# Patient Record
Sex: Female | Born: 1975 | Race: White | Hispanic: No | Marital: Married | Smoking: Never smoker
Health system: Southern US, Community
[De-identification: ages and names within clinical notes are randomized; demographics above are authoritative.]

## PROBLEM LIST (undated history)

## (undated) DIAGNOSIS — C4490 Unspecified malignant neoplasm of skin, unspecified: Secondary | ICD-10-CM

## (undated) DIAGNOSIS — R112 Nausea with vomiting, unspecified: Secondary | ICD-10-CM

## (undated) DIAGNOSIS — N871 Moderate cervical dysplasia: Secondary | ICD-10-CM

## (undated) DIAGNOSIS — T753XXA Motion sickness, initial encounter: Secondary | ICD-10-CM

## (undated) DIAGNOSIS — R87619 Unspecified abnormal cytological findings in specimens from cervix uteri: Secondary | ICD-10-CM

## (undated) DIAGNOSIS — Z8041 Family history of malignant neoplasm of ovary: Secondary | ICD-10-CM

## (undated) DIAGNOSIS — R079 Chest pain, unspecified: Secondary | ICD-10-CM

## (undated) DIAGNOSIS — F329 Major depressive disorder, single episode, unspecified: Secondary | ICD-10-CM

## (undated) DIAGNOSIS — K219 Gastro-esophageal reflux disease without esophagitis: Secondary | ICD-10-CM

## (undated) DIAGNOSIS — F32A Depression, unspecified: Secondary | ICD-10-CM

## (undated) DIAGNOSIS — H8109 Meniere's disease, unspecified ear: Secondary | ICD-10-CM

## (undated) DIAGNOSIS — I471 Supraventricular tachycardia, unspecified: Secondary | ICD-10-CM

## (undated) DIAGNOSIS — J302 Other seasonal allergic rhinitis: Secondary | ICD-10-CM

## (undated) DIAGNOSIS — N3010 Interstitial cystitis (chronic) without hematuria: Secondary | ICD-10-CM

## (undated) HISTORY — PX: CYSTOSCOPY: SUR368

## (undated) HISTORY — DX: Moderate cervical dysplasia: N87.1

## (undated) HISTORY — DX: Family history of malignant neoplasm of ovary: Z80.41

## (undated) HISTORY — DX: Unspecified abnormal cytological findings in specimens from cervix uteri: R87.619

## (undated) HISTORY — DX: Interstitial cystitis (chronic) without hematuria: N30.10

## (undated) HISTORY — DX: Unspecified malignant neoplasm of skin, unspecified: C44.90

## (undated) HISTORY — PX: CHOLECYSTECTOMY: SHX55

---

## 2004-12-29 ENCOUNTER — Ambulatory Visit: Payer: Self-pay | Admitting: Urology

## 2007-05-12 HISTORY — PX: CARPAL TUNNEL RELEASE: SHX101

## 2007-05-12 HISTORY — PX: DILATION AND CURETTAGE OF UTERUS: SHX78

## 2008-03-12 ENCOUNTER — Ambulatory Visit: Payer: Self-pay

## 2008-03-15 ENCOUNTER — Ambulatory Visit: Payer: Self-pay

## 2009-05-11 HISTORY — PX: ABDOMINAL HYSTERECTOMY: SHX81

## 2009-06-12 ENCOUNTER — Ambulatory Visit: Payer: Self-pay

## 2009-06-20 ENCOUNTER — Ambulatory Visit: Payer: Self-pay

## 2012-10-30 ENCOUNTER — Emergency Department: Payer: Self-pay | Admitting: Internal Medicine

## 2014-02-23 ENCOUNTER — Ambulatory Visit: Payer: Self-pay | Admitting: Otolaryngology

## 2014-03-19 DIAGNOSIS — T7840XA Allergy, unspecified, initial encounter: Secondary | ICD-10-CM | POA: Insufficient documentation

## 2014-03-19 DIAGNOSIS — K219 Gastro-esophageal reflux disease without esophagitis: Secondary | ICD-10-CM | POA: Insufficient documentation

## 2014-05-11 DIAGNOSIS — N871 Moderate cervical dysplasia: Secondary | ICD-10-CM

## 2014-05-11 HISTORY — PX: LEEP: SHX91

## 2014-05-11 HISTORY — DX: Moderate cervical dysplasia: N87.1

## 2014-06-12 ENCOUNTER — Ambulatory Visit: Payer: Self-pay | Admitting: Adult Health

## 2014-09-28 ENCOUNTER — Emergency Department
Admission: EM | Admit: 2014-09-28 | Discharge: 2014-09-29 | Disposition: A | Discharge status: Home / Self Care | Payer: 59 | Attending: Emergency Medicine | Admitting: Emergency Medicine

## 2014-09-28 ENCOUNTER — Encounter: Payer: Self-pay | Admitting: Medical Oncology

## 2014-09-28 DIAGNOSIS — N99820 Postprocedural hemorrhage and hematoma of a genitourinary system organ or structure following a genitourinary system procedure: Secondary | ICD-10-CM | POA: Insufficient documentation

## 2014-09-28 DIAGNOSIS — Y838 Other surgical procedures as the cause of abnormal reaction of the patient, or of later complication, without mention of misadventure at the time of the procedure: Secondary | ICD-10-CM | POA: Insufficient documentation

## 2014-09-28 DIAGNOSIS — N939 Abnormal uterine and vaginal bleeding, unspecified: Secondary | ICD-10-CM

## 2014-09-28 HISTORY — DX: Depression, unspecified: F32.A

## 2014-09-28 HISTORY — DX: Major depressive disorder, single episode, unspecified: F32.9

## 2014-09-28 HISTORY — DX: Gastro-esophageal reflux disease without esophagitis: K21.9

## 2014-09-28 HISTORY — DX: Other seasonal allergic rhinitis: J30.2

## 2014-09-28 HISTORY — DX: Chest pain, unspecified: R07.9

## 2014-09-28 LAB — CBC
HEMATOCRIT: 41.7 % (ref 35.0–47.0)
Hemoglobin: 14.2 g/dL (ref 12.0–16.0)
MCH: 31.3 pg (ref 26.0–34.0)
MCHC: 34 g/dL (ref 32.0–36.0)
MCV: 91.9 fL (ref 80.0–100.0)
Platelets: 379 10*3/uL (ref 150–440)
RBC: 4.54 MIL/uL (ref 3.80–5.20)
RDW: 12.9 % (ref 11.5–14.5)
WBC: 14.5 10*3/uL — AB (ref 3.6–11.0)

## 2014-09-28 MED ORDER — HYDROMORPHONE HCL 1 MG/ML IJ SOLN
INTRAMUSCULAR | Status: AC
  Administered 2014-09-28: 1 mg via INTRAMUSCULAR
  Filled 2014-09-28: qty 1

## 2014-09-28 MED ORDER — HYDROMORPHONE HCL 1 MG/ML IJ SOLN
1.0000 mg | Freq: Once | INTRAMUSCULAR | Status: AC
  Administered 2014-09-28: 1 mg via INTRAMUSCULAR

## 2014-09-28 NOTE — ED Notes (Signed)
Pt ambulatory to triage with reports that she had LEEP procedure last week and since then pt has been having heavy vaginal bleeding, seen pcp and they used silver nitrate Tuesday and again today, bleeding continues.

## 2014-09-28 NOTE — Consult Note (Addendum)
Obstetrics & Gynecology Consult H&P    Consulting Department: Emergency Department  Consulting Physician: Francene Castle MD  Consulting Question: LEEP, postoperative bleeding   History of Present Illness: Patient is a 39 y.o. history of prior hysterectomy, recent LEEP procedure in the last week with continued postoperative bleeding.  She has been seen and evaluated twice for her bleeding the in the past week with the last time being earlier today by Dr. Kenton Kingfisher.  She started feeling lightheaded and dizzy and was instructed to present to the ER should she be anemic enough to require transfusion or bleeding was heavy enough to warrant surgical intervention via cautery of the LEEP bed +/- stay sutures.    Review of Systems:10 point review of systems  Past Medical History:  Past Medical History  Diagnosis Date  . Chest pain   . GERD (gastroesophageal reflux disease)   . Depression   . Seasonal allergies     Past Surgical History:  Past Surgical History  Procedure Laterality Date  . Cholecystectomy    . Abdominal hysterectomy      Obstetric History: No obstetric history on file.  Family History:  No family history on file.  Social History:  History   Social History  . Marital Status: Married    Spouse Name: N/A  . Number of Children: N/A  . Years of Education: N/A   Occupational History  . Not on file.   Social History Main Topics  . Smoking status: Never Smoker   . Smokeless tobacco: Not on file  . Alcohol Use: No  . Drug Use: Not on file  . Sexual Activity: Not on file   Other Topics Concern  . Not on file   Social History Narrative  . No narrative on file    Allergies:  No Known Allergies  Medications: Prior to Admission medications   Not on File    Physical Exam Vitals: Blood pressure 123/84, pulse 91, temperature 97.8 F (36.6 C), temperature source Oral, resp. rate 18, height 5\' 7"  (1.702 m), weight 65.772 kg (145 lb), SpO2 100 %. General:  NAD HEENT: normocephalic, anicteric Pulmonary: no increased work of breathing Genitourinary: Normal external female genitalia, minimal to moderate amount of bleeding noted on perineum.  Speculum exam with one scopet of old blood in vault, no active bleeding noted from LEEP bed.  Surgicel dressing applied to LEEP bed to ensure hemostasis Extremities: no edema, no erythema Neurologic: no gross neurologic deficitis Skin: normal Psychiatric: A&O x3, mood apporpriate, affect full  Labs: Results for orders placed or performed during the hospital encounter of 09/28/14 (from the past 72 hour(s))  CBC (add if pt pregnant)     Status: Abnormal   Collection Time: 09/28/14  6:24 PM  Result Value Ref Range   WBC 14.5 (H) 3.6 - 11.0 K/uL   RBC 4.54 3.80 - 5.20 MIL/uL   Hemoglobin 14.2 12.0 - 16.0 g/dL   HCT 41.7 35.0 - 47.0 %   MCV 91.9 80.0 - 100.0 fL   MCH 31.3 26.0 - 34.0 pg   MCHC 34.0 32.0 - 36.0 g/dL   RDW 12.9 11.5 - 14.5 %   Platelets 379 150 - 440 K/uL    Imaging No results found.  Assessment: 39 y.o. No obstetric history on file.  Plan: - appropriate amount of bleeding for post LEEP procedure, LEEP bed looks relatively hemostatic but surgicel dressing applied - routine bleeding precautions. - If bleeding persists discussed possibility of cauterizing LEEP bed in office -  Rx percocet 5/325 sig 1 tab po q4hrs prn pain #20tab provided

## 2014-09-28 NOTE — ED Provider Notes (Signed)
[facility] Emergency Department Provider Note  ____________________________________________  Time seen: 2030  I have reviewed the triage vital signs and the nursing notes.   HISTORY  Chief Complaint Vaginal Bleeding  status post LEEP procedure 1 week ago.    HPI Ann Rhodes is a 39 y.o. female who had a LEEP procedure 1 week ago. She has had vaginal bleeding since then. At times it is heavy. She was seen by her gynecologist on Tuesday and the area was cauterized chemically with silver nitrate. The bleeding has continued and she was seen in the office again earlier today. It was again treated with silver nitrate. Patient now reports that she has ongoing vaginal bleeding. She reports going through 4 pads in 1 hour. She has felt weak and lightheaded at times. She complains of vaginal pain.   Past Medical History  Diagnosis Date  . Chest pain   . GERD (gastroesophageal reflux disease)   . Depression   . Seasonal allergies     There are no active problems to display for this patient.   Past Surgical History  Procedure Laterality Date  . Cholecystectomy    . Abdominal hysterectomy     LEEP procedure  No current outpatient prescriptions on file.  Allergies Review of patient's allergies indicates no known allergies.  No family history on file.  Social History History  Substance Use Topics  . Smoking status: Never Smoker   . Smokeless tobacco: Not on file  . Alcohol Use: No    Review of Systems Constitutional: Negative for fever. ENT: Negative for sore throat. Cardiovascular: Negative for chest pain. Respiratory: Negative for shortness of breath. Gastrointestinal: Negative for abdominal pain, vomiting and diarrhea. Genitourinary: Notable for vaginal bleeding status post LEEP. See history of present illness. Musculoskeletal: Negative for back pain. Skin: Negative for rash. Neurological: Negative for headaches   10-point ROS otherwise  negative.  ____________________________________________   PHYSICAL EXAM:  VITAL SIGNS: ED Triage Vitals  Enc Vitals Group     BP 09/28/14 1808 138/85 mmHg     Pulse Rate 09/28/14 1808 91     Resp --      Temp 09/28/14 1808 97.8 F (36.6 C)     Temp Source 09/28/14 1808 Oral     SpO2 09/28/14 1808 100 %     Weight 09/28/14 1808 145 lb (65.772 kg)     Height 09/28/14 1808 5\' 7"  (1.702 m)     Head Cir --      Peak Flow --      Pain Score 09/28/14 1818 9     Pain Loc --      Pain Edu? --      Excl. in [place]? --     Constitutional: Alert and oriented. Well appearing and in no distress. ENT   Head: Normocephalic and atraumatic.   Nose: No congestion/rhinnorhea.   Mouth/Throat: Mucous membranes are moist. Cardiovascular: Normal rate, regular rhythm. Respiratory: Normal respiratory effort without tachypnea. Breath sounds are clear and equal bilaterally. No wheezes/rales/rhonchi. Gastrointestinal: Soft and nontender. No distention.  Back: There is no CVA tenderness. Musculoskeletal: Nontender with normal range of motion in all extremities.  No noted edema. Neurologic:  Normal speech and language. No gross focal neurologic deficits are appreciated.  Skin:  Skin is warm, dry. No rash noted. Psychiatric: Mood and affect are normal. Speech and behavior are normal.  ____________________________________________    LABS (pertinent positives/negatives)  White blood cell count 14.5, hemoglobin 14.2  ____________________________________________ ____________________________________________  INITIAL IMPRESSION / ASSESSMENT AND PLAN / ED COURSE  This patient overall looks well. Her hemoglobin level is quite good despite the vaginal bleeding she has had. Given that this patient has been seen by gynecology twice this week with silver nitrate, I have called the gynecologist on call Dr. Georgianne Fick. He will see her in the emergency department for further  evaluation.  ----------------------------------------- 9:53 PM on 09/28/2014 -----------------------------------------  Dr. Star Age saw the patient emergency department. He had her bleeding controlled. He wrote her a prescription pain medication. She will be followed up at [facility] side GYN. ____________________________________________   FINAL CLINICAL IMPRESSION(S) / ED DIAGNOSES  Final diagnoses:  Vaginal bleeding      Ahmed Prima, MD 09/28/14 2156

## 2014-09-28 NOTE — ED Notes (Signed)
Pt. States going to PCP on Tuesday and Today and used silver nitrate with little effectiveness.

## 2014-11-21 ENCOUNTER — Encounter (INDEPENDENT_AMBULATORY_CARE_PROVIDER_SITE_OTHER): Payer: Self-pay

## 2014-11-21 ENCOUNTER — Ambulatory Visit (INDEPENDENT_AMBULATORY_CARE_PROVIDER_SITE_OTHER): Payer: 59 | Admitting: Cardiovascular Disease

## 2014-11-21 ENCOUNTER — Encounter: Payer: Self-pay | Admitting: Cardiovascular Disease

## 2014-11-21 VITALS — BP 112/78 | HR 75 | Ht 67.0 in | Wt 146.8 lb

## 2014-11-21 DIAGNOSIS — R079 Chest pain, unspecified: Secondary | ICD-10-CM

## 2014-11-21 DIAGNOSIS — R0602 Shortness of breath: Secondary | ICD-10-CM | POA: Insufficient documentation

## 2014-11-21 DIAGNOSIS — F329 Major depressive disorder, single episode, unspecified: Secondary | ICD-10-CM | POA: Diagnosis not present

## 2014-11-21 DIAGNOSIS — H8109 Meniere's disease, unspecified ear: Secondary | ICD-10-CM | POA: Insufficient documentation

## 2014-11-21 DIAGNOSIS — F32A Depression, unspecified: Secondary | ICD-10-CM | POA: Insufficient documentation

## 2014-11-21 NOTE — Progress Notes (Signed)
Patient ID: Ann Rhodes, female    DOB: [birth year], 39 y.o.   MRN: [id number]  HPI Comments: Ann Rhodes is a 39 year old woman with history of Mnire's disease for which she takes triamterene HCTZ, GERD, presenting for evaluation of chest pain. She was recently seen by Dr. Merryl Hacker cardiology for similar symptoms.  Earlier in 2016 when her symptoms developed, she performed a treadmill stress echo. Review of the report shows she had good exercise tolerance, adequate blood pressure control, with no EKG changes concerning for ischemia, no wall motion abnormality concerning for ischemia.  She was started on isosorbide mononitrate for possible coronary spasm. On this medication she felt better with improved symptoms. She tried to stop the medication and symptoms recurred. Several weeks ago she went hiking and climbing a mountain, had significant shortness of breath and fatigue. Since then she has had persistent chest pain symptoms even at rest, feels worse even taking her isosorbide. Now complains of chronic shortness of breath, fatigue, unable to do anything   EKG shows normal sinus rhythm with rate 75 bpm, no significant ST or T-wave changes  06/12/2014 had barium swallow which was normal      No Known Allergies  No current outpatient prescriptions on file prior to visit.   No current facility-administered medications on file prior to visit.    Past Medical History  Diagnosis Date  . Chest pain   . GERD (gastroesophageal reflux disease)   . Depression   . Seasonal allergies     Past Surgical History  Procedure Laterality Date  . Cholecystectomy    . Abdominal hysterectomy      Social History  reports that she has never smoked. She does not have any smokeless tobacco history on file. She reports that she does not drink alcohol or use illicit drugs.  Family History family history includes Heart disease in her father.   Review of Systems  Constitutional:  Negative.   HENT: Negative.   Respiratory: Positive for shortness of breath.   Cardiovascular: Positive for chest pain.  Gastrointestinal: Negative.   Musculoskeletal: Negative.   Skin: Negative.   Neurological: Negative.   Hematological: Negative.   Psychiatric/Behavioral: Negative.   All other systems reviewed and are negative.  BP 112/78 mmHg  Pulse 75  Ht 5\' 7"  (1.702 m)  Wt 146 lb 12 oz (66.565 kg)  BMI 22.98 kg/m2  Physical Exam  Constitutional: She is oriented to person, place, and time. She appears well-developed and well-nourished.  HENT:  Head: Normocephalic.  Nose: Nose normal.  Mouth/Throat: Oropharynx is clear and moist.  Eyes: Conjunctivae are normal. Pupils are equal, round, and reactive to light.  Neck: Normal range of motion. Neck supple. No JVD present.  Cardiovascular: Normal rate, regular rhythm, S1 normal, S2 normal, normal heart sounds and intact distal pulses.  Exam reveals no gallop and no friction rub.   No murmur heard. Pulmonary/Chest: Effort normal and breath sounds normal. No respiratory distress. She has no wheezes. She has no rales. She exhibits no tenderness.  Abdominal: Soft. Bowel sounds are normal. She exhibits no distension. There is no tenderness.  Musculoskeletal: Normal range of motion. She exhibits no edema or tenderness.  Lymphadenopathy:    She has no cervical adenopathy.  Neurological: She is alert and oriented to person, place, and time. Coordination normal.  Skin: Skin is warm and dry. No rash noted. No erythema.  Psychiatric: She has a normal mood and affect. Her behavior is normal. Judgment and thought content  normal.    Assessment and Plan  Nursing note and vitals reviewed.

## 2014-11-21 NOTE — Assessment & Plan Note (Signed)
Currently on sertraline. Unclear if this is playing a role in her current symptoms

## 2014-11-21 NOTE — Assessment & Plan Note (Signed)
Atypical chest pain, even at rest today in the clinic Does not appear to be musculoskeletal. Low likelihood of ischemia given no risk factors   she is a nonsmoker, no diabetes, total cholesterol 160 She is troubled by her persistent pain. Prior negative stress test January 2016. She is looking for answers, not more medications. We did offer Ranexa for possible symptom relief. She did not want new medication at this time Encouraged her to stay on her isosorbide if this helps We have ordered a chest CT scan with contrast to rule out structural abnormality, cause of her shortness of breath, chest pain

## 2014-11-21 NOTE — Assessment & Plan Note (Signed)
She takes triamterene HCTZ, no recent symptoms

## 2014-11-21 NOTE — Assessment & Plan Note (Signed)
Etiology unclear, report details normal echocardiogram. Likely no point in repeating this echocardiogram. No signs of heart failure.  She denies having any asthma. Last resort would be to try albuterol inhaler for symptom relief if symptoms persist

## 2014-11-21 NOTE — Patient Instructions (Addendum)
You are doing well. No medication changes were made.  We will schedule a chest CT scan with contrast for shortness of breath and chest pain You are scheduled in the [place] location on Monday, July 18 @ 2:00 Please arrive @ 1:45 to register  Please call us if you have new issues that need to be addressed before your next appt.   CT Scan A computed tomography (CT) scan is a specialized X-ray scan. It uses X-rays and a computer to make pictures of different areas of your body. A CT scan can offer more detailed information than a regular X-ray exam. The CT scan provides data about internal organs, soft tissue structures, blood vessels, and bones.  The CT scanner is a large machine that takes pictures of your body as you move through the opening.  LET [facility] CARE PROVIDER KNOW ABOUT:  Any allergies you have.   All medicines you are taking, including vitamins, herbs, eye drops, creams, and over-the-counter medicines.   Previous problems you or members of your family have had with the use of anesthetics.   Any blood disorders you have.   Previous surgeries you have had.   Medical conditions you have. RISKS AND COMPLICATIONS  Generally, this is a safe procedure. However, as with any procedure, problems can occur. Possible problems include:   An allergic reaction to the contrast material.   Development of cancer from excessive exposure to radiation. The risk of this is small.  BEFORE THE PROCEDURE   The day before the test, stop drinking caffeinated beverages. These include energy drinks, tea, soda, coffee, and hot chocolate.   On the day of the test:  About 4 hours before the test, stop eating and drinking anything but water as advised by your health care provider.   Avoid wearing jewelry. You will have to partly or fully undress and wear a hospital gown. PROCEDURE   You will be asked to lie on a table with your arms above your head.   If contrast dye is to be used  for the test, an IV tube will be inserted in your arm. The contrast dye will be injected into the IV tube. You might feel warm, or you may get a metallic taste in your mouth.   The table you will be lying on will move into a large machine that will do the scanning.   You will be able to see, hear, and talk to the person running the machine while you are in it. Follow that person's directions.   The CT machine will move around you to take pictures. Do not move while it is scanning. This helps to get a good image.   When the best possible pictures have been taken, the machine will be turned off. The table will be moved out of the machine. The IV tube will then be removed. AFTER THE PROCEDURE  Ask your health care provider when to follow up for your test results. Document Released: 06/04/2004 Document Revised: 05/02/2013 Document Reviewed: 01/02/2013 [facility] Patient Information 2015 [place], [place]. This information is not intended to replace advice given to you by your health care provider. Make sure you discuss any questions you have with your health care provider.

## 2014-11-26 ENCOUNTER — Ambulatory Visit
Admission: RE | Admit: 2014-11-26 | Discharge: 2014-11-26 | Disposition: A | Discharge status: Home / Self Care | Payer: 59 | Source: Ambulatory Visit | Attending: Cardiovascular Disease | Admitting: Cardiovascular Disease

## 2014-11-26 DIAGNOSIS — R079 Chest pain, unspecified: Secondary | ICD-10-CM | POA: Insufficient documentation

## 2014-11-26 DIAGNOSIS — R0602 Shortness of breath: Secondary | ICD-10-CM

## 2014-11-26 MED ORDER — IOHEXOL 350 MG/ML SOLN
75.0000 mL | Freq: Once | INTRAVENOUS | Status: AC | PRN
  Administered 2014-11-26: 75 mL via INTRAVENOUS

## 2016-09-11 ENCOUNTER — Ambulatory Visit (INDEPENDENT_AMBULATORY_CARE_PROVIDER_SITE_OTHER): Payer: 59 | Admitting: Obstetrics and Gynecology

## 2016-09-11 ENCOUNTER — Encounter: Payer: Self-pay | Admitting: Obstetrics and Gynecology

## 2016-09-11 VITALS — BP 102/60 | HR 76 | Ht 67.0 in | Wt 162.0 lb

## 2016-09-11 DIAGNOSIS — Z1239 Encounter for other screening for malignant neoplasm of breast: Secondary | ICD-10-CM

## 2016-09-11 DIAGNOSIS — Z1231 Encounter for screening mammogram for malignant neoplasm of breast: Secondary | ICD-10-CM | POA: Diagnosis not present

## 2016-09-11 DIAGNOSIS — Z1151 Encounter for screening for human papillomavirus (HPV): Secondary | ICD-10-CM | POA: Diagnosis not present

## 2016-09-11 DIAGNOSIS — Z124 Encounter for screening for malignant neoplasm of cervix: Secondary | ICD-10-CM

## 2016-09-11 DIAGNOSIS — Z01419 Encounter for gynecological examination (general) (routine) without abnormal findings: Secondary | ICD-10-CM

## 2016-09-11 DIAGNOSIS — Z803 Family history of malignant neoplasm of breast: Secondary | ICD-10-CM

## 2016-09-11 NOTE — Progress Notes (Signed)
Chief Complaint  Patient presents with  . Gynecologic Exam     HPI:      Ann Rhodes is a 41 y.o. G1P1001 who LMP was No LMP recorded. Patient has had a hysterectomy., presents today for her annual examination.  Her menses are absent due to hyst [facility] due to adenomyosis). She does not have intermenstrual bleeding.  Sex activity: single partner, contraception - status post hysterectomy.  Last Pap: July 22, 2015  Results were: ASCUS with NEGATIVE high risk HPV . 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 Hx of STDs: HPV  Last mammogram: July 24, 2015  Results were: normal--routine follow-up in 12 months There is a FH of breast cancer in her pat aunt. There is no FH of ovarian cancer. The patient does do self-breast exams.  Tobacco use: The patient denies current or previous tobacco use. Alcohol use: none Exercise: very active  She does get adequate calcium and Vitamin D in her diet.    Past Medical History:  Diagnosis Date  . Abnormal Pap smear of cervix   . Chest pain   . Depression   . Dysplasia of cervix, high grade CIN 2 2016  . GERD (gastroesophageal reflux disease)   . Interstitial cystitis   . Seasonal allergies   . Skin cancer     Past Surgical History:  Procedure Laterality Date  . ABDOMINAL HYSTERECTOMY  2011   [facility]  . CARPAL TUNNEL RELEASE  2009  . CHOLECYSTECTOMY    . CYSTOSCOPY    . DILATION AND CURETTAGE OF UTERUS  2009   [facility]  . LEEP  2016   [facility]    Family History  Problem Relation Age of Onset  . Diabetes Mother   . Heart disease Father   . Hypertension Father   . Colon cancer Maternal Grandmother 58  . Diabetes Maternal Grandmother   . Diabetes Paternal Grandmother   . Diabetes Paternal Grandfather   . Breast cancer Paternal Aunt 43  . Cervical cancer Maternal Aunt     Social History   Social History  . Marital status: Married    Spouse name: N/A  . Number of children: N/A  . Years of education: N/A    Occupational History  . Not on file.   Social History Main Topics  . Smoking status: Never Smoker  . Smokeless tobacco: Never Used  . Alcohol use No  . Drug use: No  . Sexual activity: Yes   Other Topics Concern  . Not on file   Social History Narrative  . No narrative on file     Current Outpatient Prescriptions:  .  ALPRAZolam (XANAX) 0.25 MG tablet, , Disp: , Rfl:  .  Cholecalciferol (VITAMIN D3) 2000 UNITS capsule, Take 2,000 Units by mouth daily., Disp: , Rfl:  .  isosorbide mononitrate (IMDUR) 30 MG 24 hr tablet, Take 30 mg by mouth daily., Disp: , Rfl:  .  meloxicam (MOBIC) 15 MG tablet, , Disp: , Rfl:  .  montelukast (SINGULAIR) 10 MG tablet, Take 10 mg by mouth at bedtime., Disp: , Rfl:  .  pantoprazole (PROTONIX) 40 MG tablet, Take 40 mg by mouth daily., Disp: , Rfl:  .  sertraline (ZOLOFT) 100 MG tablet, TAKE 1-2 TABLET BY MOUTH DAILY., Disp: , Rfl:  .  triamterene-hydrochlorothiazide (MAXZIDE-25) 37.5-25 MG per tablet, Take 1 tablet by mouth daily., Disp: , Rfl:   ROS:  Review of Systems  Constitutional: Negative for fever, malaise/fatigue  and weight loss.  HENT: Negative for congestion, ear pain and sinus pain.   Respiratory: Negative for cough, shortness of breath and wheezing.   Cardiovascular: Negative for chest pain, orthopnea and leg swelling.  Gastrointestinal: Negative for constipation, diarrhea, nausea and vomiting.  Genitourinary: Negative for dysuria, frequency, hematuria and urgency.       Breast ROS: negative   Musculoskeletal: Negative for back pain, joint pain and myalgias.  Skin: Negative for itching and rash.  Neurological: Negative for dizziness, tingling, focal weakness and headaches.  Endo/Heme/Allergies: Negative for environmental allergies. Does not bruise/bleed easily.  Psychiatric/Behavioral: Negative for depression and suicidal ideas. The patient is not nervous/anxious and does not have insomnia.     Objective: BP 102/60 (BP  Location: Left Arm, Patient Position: Sitting, Cuff Size: Normal)   Pulse 76   Ht 5\' 7"  (1.702 m)   Wt 162 lb (73.5 kg)   BMI 25.37 kg/m    Physical Exam  Constitutional: She is oriented to person, place, and time. She appears well-developed and well-nourished.  Genitourinary: Vagina normal. There is no rash or tenderness on the right labia. There is no rash or tenderness on the left labia. No erythema or tenderness in the vagina. No vaginal discharge found. Right adnexum does not display mass and does not display tenderness. Left adnexum does not display mass and does not display tenderness. Cervix does not exhibit motion tenderness or polyp.  Genitourinary Comments: UTERUS SURG ABSENT  Neck: Normal range of motion. No thyromegaly present.  Cardiovascular: Normal rate, regular rhythm and normal heart sounds.   No murmur heard. Pulmonary/Chest: Effort normal and breath sounds normal. Right breast exhibits no mass, no nipple discharge, no skin change and no tenderness. Left breast exhibits no mass, no nipple discharge, no skin change and no tenderness.  Abdominal: Soft. There is no tenderness. There is no guarding.  Musculoskeletal: Normal range of motion.  Neurological: She is alert and oriented to person, place, and time. No cranial nerve deficit.  Psychiatric: She has a normal mood and affect. Her behavior is normal.  Vitals reviewed.    Assessment/Plan: Encounter for annual routine gynecological examination  Cervical cancer screening - Plan: IGP, Aptima HPV  Screening for HPV (human papillomavirus) - Plan: IGP, Aptima HPV  Screening for breast cancer - Pt to sched mammo. - Plan: MM SCREENING BREAST TOMO BILATERAL  Family history of breast cancer - Pt doesn't qualify for genetic testing. - Plan: MM SCREENING BREAST TOMO BILATERAL             GYN counsel mammography screening, adequate intake of calcium and vitamin D, diet and exercise     F/U  Return in about 1 year (around  09/11/2017).  Alicia B. Copland, PA-C 09/11/2016 1:50 PM

## 2016-09-15 LAB — IGP, APTIMA HPV
HPV Aptima: NEGATIVE
PAP Smear Comment: 0

## 2016-10-09 ENCOUNTER — Ambulatory Visit
Admission: RE | Admit: 2016-10-09 | Discharge: 2016-10-09 | Disposition: A | Discharge status: Home / Self Care | Payer: 59 | Source: Ambulatory Visit | Attending: Obstetrics and Gynecology | Admitting: Obstetrics and Gynecology

## 2016-10-09 DIAGNOSIS — Z803 Family history of malignant neoplasm of breast: Secondary | ICD-10-CM

## 2016-10-09 DIAGNOSIS — Z1239 Encounter for other screening for malignant neoplasm of breast: Secondary | ICD-10-CM

## 2016-10-09 DIAGNOSIS — Z1231 Encounter for screening mammogram for malignant neoplasm of breast: Secondary | ICD-10-CM | POA: Insufficient documentation

## 2016-10-15 ENCOUNTER — Inpatient Hospital Stay
Admission: RE | Admit: 2016-10-15 | Discharge: 2016-10-15 | Disposition: A | Discharge status: Home / Self Care | Payer: Self-pay | Source: Ambulatory Visit | Attending: *Deleted | Admitting: *Deleted

## 2016-10-15 ENCOUNTER — Other Ambulatory Visit: Payer: Self-pay | Admitting: *Deleted

## 2016-10-15 DIAGNOSIS — Z9289 Personal history of other medical treatment: Secondary | ICD-10-CM

## 2016-10-18 ENCOUNTER — Encounter: Payer: Self-pay | Admitting: Obstetrics and Gynecology

## 2017-09-14 ENCOUNTER — Other Ambulatory Visit: Payer: Self-pay | Admitting: Family Medicine

## 2017-09-14 DIAGNOSIS — Z1231 Encounter for screening mammogram for malignant neoplasm of breast: Secondary | ICD-10-CM

## 2017-09-21 ENCOUNTER — Encounter: Payer: Self-pay | Admitting: Obstetrics and Gynecology

## 2017-09-21 ENCOUNTER — Ambulatory Visit (INDEPENDENT_AMBULATORY_CARE_PROVIDER_SITE_OTHER): Payer: Managed Care, Other (non HMO) | Admitting: Obstetrics and Gynecology

## 2017-09-21 VITALS — BP 118/72 | HR 68 | Ht 67.0 in | Wt 165.5 lb

## 2017-09-21 DIAGNOSIS — Z1239 Encounter for other screening for malignant neoplasm of breast: Secondary | ICD-10-CM

## 2017-09-21 DIAGNOSIS — Z124 Encounter for screening for malignant neoplasm of cervix: Secondary | ICD-10-CM

## 2017-09-21 DIAGNOSIS — Z01419 Encounter for gynecological examination (general) (routine) without abnormal findings: Secondary | ICD-10-CM | POA: Diagnosis not present

## 2017-09-21 DIAGNOSIS — Z1151 Encounter for screening for human papillomavirus (HPV): Secondary | ICD-10-CM

## 2017-09-21 DIAGNOSIS — Z1231 Encounter for screening mammogram for malignant neoplasm of breast: Secondary | ICD-10-CM | POA: Diagnosis not present

## 2017-09-21 NOTE — Patient Instructions (Signed)
I value your feedback and entrusting us with your care. If you get a [facility] patient survey, I would appreciate you taking the time to let us know about your experience today. Thank you! 

## 2017-09-21 NOTE — Progress Notes (Signed)
Chief Complaint  Patient presents with  . Gynecologic Exam     HPI:      [name] is a 42 y.o. G1P1001 who LMP was No LMP recorded. Patient has had a hysterectomy., presents today for her annual examination. Her menses are absent due to hyst [facility] due to adenomyosis). She does not have intermenstrual bleeding.  Sex activity: single partner, contraception - status post hysterectomy.  Last Pap: 09/11/16  Results were: negative cells with NEGATIVE high risk HPV . 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 Hx of STDs: HPV   Last mammogram: 10/09/16  Results were: normal--routine follow-up in 12 months There is a FH of breast cancer in her pat aunt, genetic testing not indicated. There is no FH of ovarian cancer. The patient does do self-breast exams.  Tobacco use: The patient denies current or previous tobacco use. Alcohol use: none Exercise: very active  She does get adequate calcium and Vitamin D in her diet.  Labs with PCP   Past Medical History:  Diagnosis Date  . Abnormal Pap smear of cervix   . Chest pain   . Depression   . Dysplasia of cervix, high grade CIN 2 2016  . GERD (gastroesophageal reflux disease)   . Interstitial cystitis   . Seasonal allergies   . Skin cancer     Past Surgical History:  Procedure Laterality Date  . ABDOMINAL HYSTERECTOMY  2011   [facility]  . CARPAL TUNNEL RELEASE  2009  . CHOLECYSTECTOMY    . CYSTOSCOPY    . DILATION AND CURETTAGE OF UTERUS  2009   [facility]  . LEEP  2016   [facility]    Family History  Problem Relation Age of Onset  . Diabetes Mother   . Heart disease Father   . Hypertension Father   . Colon cancer Maternal Grandmother 52  . Diabetes Maternal Grandmother   . Diabetes Paternal Grandmother   . Diabetes Paternal Grandfather   . Breast cancer Paternal Aunt 66  . Cervical cancer Maternal Aunt     Social History   Socioeconomic History  . Marital status: Married    Spouse name: Not on file  .  Number of children: Not on file  . Years of education: Not on file  . Highest education level: Not on file  Occupational History  . Not on file  Social Needs  . Financial resource strain: Not on file  . Food insecurity:    Worry: Not on file    Inability: Not on file  . Transportation needs:    Medical: Not on file    Non-medical: Not on file  Tobacco Use  . Smoking status: Never Smoker  . Smokeless tobacco: Never Used  Substance and Sexual Activity  . Alcohol use: No  . Drug use: No  . Sexual activity: Yes    Birth control/protection: Surgical    Comment: Hysterectomy  Lifestyle  . Physical activity:    Days per week: Not on file    Minutes per session: Not on file  . Stress: Not on file  Relationships  . Social connections:    Talks on phone: Not on file    Gets together: Not on file    Attends religious service: Not on file    Active member of club or organization: Not on file    Attends meetings of clubs or organizations: Not on file    Relationship status: Not on file  .  Intimate partner violence:    Fear of current or ex partner: Not on file    Emotionally abused: Not on file    Physically abused: Not on file    Forced sexual activity: Not on file  Other Topics Concern  . Not on file  Social History Narrative  . Not on file    Current Outpatient Medications on File Prior to Visit  Medication Sig Dispense Refill  . ALPRAZolam (XANAX) 0.25 MG tablet     . Cholecalciferol (VITAMIN D3) 2000 UNITS capsule Take 2,000 Units by mouth daily.    . fluticasone (FLONASE) 50 MCG/ACT nasal spray Place into the nose.    . isosorbide mononitrate (IMDUR) 30 MG 24 hr tablet Take 30 mg by mouth daily.    . montelukast (SINGULAIR) 10 MG tablet Take 10 mg by mouth at bedtime.    . Olopatadine HCl (PAZEO) 0.7 % SOLN TAKE 1-2 DROP(S) IN BOTH EYES ONCE A DAY    . pantoprazole (PROTONIX) 40 MG tablet Take 40 mg by mouth daily.    . sertraline (ZOLOFT) 100 MG tablet TAKE 1-2 TABLET  BY MOUTH DAILY.    Marland Kitchen tiZANidine (ZANAFLEX) 2 MG tablet Take by mouth.    . meloxicam (MOBIC) 15 MG tablet     . triamterene-hydrochlorothiazide (MAXZIDE-25) 37.5-25 MG per tablet Take 1 tablet by mouth daily.     No current facility-administered medications on file prior to visit.       ROS:  Review of Systems  Constitutional: Negative for fatigue, fever and unexpected weight change.  Respiratory: Negative for cough, shortness of breath and wheezing.   Cardiovascular: Negative for chest pain, palpitations and leg swelling.  Gastrointestinal: Negative for blood in stool, constipation, diarrhea, nausea and vomiting.  Endocrine: Negative for cold intolerance, heat intolerance and polyuria.  Genitourinary: Negative for dyspareunia, dysuria, flank pain, frequency, genital sores, hematuria, menstrual problem, pelvic pain, urgency, vaginal bleeding, vaginal discharge and vaginal pain.  Musculoskeletal: Negative for back pain, joint swelling and myalgias.  Skin: Negative for rash.  Neurological: Negative for dizziness, syncope, light-headedness, numbness and headaches.  Hematological: Negative for adenopathy.  Psychiatric/Behavioral: Negative for agitation, confusion, sleep disturbance and suicidal ideas. The patient is not nervous/anxious.      Objective: BP 118/72   Pulse 68   Ht 5\' 7"  (1.702 m)   Wt 165 lb 8 oz (75.1 kg)   BMI 25.92 kg/m    Physical Exam  Constitutional: She is oriented to person, place, and time. She appears well-developed and well-nourished.  Genitourinary: Vagina normal. There is no rash or tenderness on the right labia. There is no rash or tenderness on the left labia. No erythema or tenderness in the vagina. No vaginal discharge found. Right adnexum does not display mass and does not display tenderness. Left adnexum does not display mass and does not display tenderness. Cervix does not exhibit motion tenderness or polyp.  Genitourinary Comments: UTERUS SURG  ABSENT  Neck: Normal range of motion. No thyromegaly present.  Cardiovascular: Normal rate, regular rhythm and normal heart sounds.  No murmur heard. Pulmonary/Chest: Effort normal and breath sounds normal. Right breast exhibits no mass, no nipple discharge, no skin change and no tenderness. Left breast exhibits no mass, no nipple discharge, no skin change and no tenderness.  Abdominal: Soft. There is no tenderness. There is no guarding.  Musculoskeletal: Normal range of motion.  Neurological: She is alert and oriented to person, place, and time. No cranial nerve deficit.  Psychiatric: She  has a normal mood and affect. Her behavior is normal.  Vitals reviewed.   Assessment/Plan: Encounter for annual routine gynecological examination  Cervical cancer screening - Plan: IGP, Aptima HPV  Screening for HPV (human papillomavirus) - Plan: IGP, Aptima HPV  Screening for breast cancer - Pt has mammo 6/19.           GYN counsel breast self exam, mammography screening, adequate intake of calcium and vitamin D, diet and exercise     F/U  Return in about 1 year (around 09/22/2018).  Kamsiyochukwu Buist B. Conan Mcmanaway, PA-C 09/21/2017 2:21 PM

## 2017-09-23 LAB — IGP, APTIMA HPV
HPV Aptima: NEGATIVE
PAP Smear Comment: 0

## 2017-10-11 ENCOUNTER — Ambulatory Visit
Admission: RE | Admit: 2017-10-11 | Discharge: 2017-10-11 | Disposition: A | Discharge status: Home / Self Care | Payer: Managed Care, Other (non HMO) | Source: Ambulatory Visit | Attending: Family Medicine | Admitting: Family Medicine

## 2017-10-11 DIAGNOSIS — Z1231 Encounter for screening mammogram for malignant neoplasm of breast: Secondary | ICD-10-CM

## 2017-10-18 ENCOUNTER — Other Ambulatory Visit: Payer: Self-pay | Admitting: Family Medicine

## 2017-10-18 DIAGNOSIS — R928 Other abnormal and inconclusive findings on diagnostic imaging of breast: Secondary | ICD-10-CM

## 2017-10-21 ENCOUNTER — Ambulatory Visit
Admission: RE | Admit: 2017-10-21 | Discharge: 2017-10-21 | Disposition: A | Discharge status: Home / Self Care | Payer: Managed Care, Other (non HMO) | Source: Ambulatory Visit | Attending: Family Medicine | Admitting: Family Medicine

## 2017-10-21 DIAGNOSIS — R928 Other abnormal and inconclusive findings on diagnostic imaging of breast: Secondary | ICD-10-CM

## 2018-09-28 ENCOUNTER — Ambulatory Visit: Payer: Managed Care, Other (non HMO) | Admitting: Obstetrics and Gynecology

## 2018-10-06 ENCOUNTER — Other Ambulatory Visit: Payer: Self-pay | Admitting: Obstetrics and Gynecology

## 2018-10-06 ENCOUNTER — Other Ambulatory Visit: Payer: Self-pay | Admitting: Family Medicine

## 2018-10-06 ENCOUNTER — Other Ambulatory Visit: Payer: Self-pay | Admitting: Certified Nurse Midwife

## 2018-10-06 DIAGNOSIS — Z1231 Encounter for screening mammogram for malignant neoplasm of breast: Secondary | ICD-10-CM

## 2018-10-21 ENCOUNTER — Ambulatory Visit
Admission: RE | Admit: 2018-10-21 | Discharge: 2018-10-21 | Disposition: A | Discharge status: Home / Self Care | Payer: Managed Care, Other (non HMO) | Source: Ambulatory Visit | Attending: Obstetrics and Gynecology | Admitting: Obstetrics and Gynecology

## 2018-10-21 ENCOUNTER — Other Ambulatory Visit: Payer: Self-pay | Admitting: Obstetrics and Gynecology

## 2018-10-21 ENCOUNTER — Other Ambulatory Visit: Payer: Self-pay

## 2018-10-21 DIAGNOSIS — R928 Other abnormal and inconclusive findings on diagnostic imaging of breast: Secondary | ICD-10-CM

## 2018-10-21 DIAGNOSIS — Z1231 Encounter for screening mammogram for malignant neoplasm of breast: Secondary | ICD-10-CM | POA: Diagnosis present

## 2018-10-21 DIAGNOSIS — R921 Mammographic calcification found on diagnostic imaging of breast: Secondary | ICD-10-CM

## 2018-10-31 ENCOUNTER — Other Ambulatory Visit: Payer: Self-pay

## 2018-10-31 ENCOUNTER — Ambulatory Visit
Admission: RE | Admit: 2018-10-31 | Discharge: 2018-10-31 | Disposition: A | Discharge status: Home / Self Care | Payer: Managed Care, Other (non HMO) | Source: Ambulatory Visit | Attending: Obstetrics and Gynecology | Admitting: Obstetrics and Gynecology

## 2018-10-31 ENCOUNTER — Encounter: Payer: Self-pay | Admitting: Obstetrics and Gynecology

## 2018-10-31 DIAGNOSIS — R928 Other abnormal and inconclusive findings on diagnostic imaging of breast: Secondary | ICD-10-CM | POA: Diagnosis present

## 2018-10-31 DIAGNOSIS — R921 Mammographic calcification found on diagnostic imaging of breast: Secondary | ICD-10-CM

## 2018-11-01 DIAGNOSIS — N871 Moderate cervical dysplasia: Secondary | ICD-10-CM | POA: Insufficient documentation

## 2018-11-01 NOTE — Progress Notes (Signed)
Chief Complaint  Patient presents with  . Annual Exam    no problems      HPI:      [name] is a 43 y.o. G1P1001 who LMP was No LMP recorded. Patient has had a hysterectomy., presents today for her annual examination. Her menses are absent due to hyst (Lap Supracx Hyst due to adenomyosis). She does not have intermenstrual bleeding.  Sex activity: single partner, contraception - status post hysterectomy.  Last Pap: 09/21/17  Results were: negative cells with NEGATIVE high risk HPV . 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 Repeat pap today. Works at [employer].  Hx of STDs: HPV   Last mammogram: 10/31/18 Results were: normal--routine follow-up in 12 months There is a FH of breast cancer in her pat aunt, genetic testing not indicated. There is no FH of ovarian cancer. The patient does do self-breast exams.  Tobacco use: The patient denies current or previous tobacco use. Alcohol use: none Exercise: very active  She does get adequate calcium and Vitamin D in her diet.  Labs with PCP   Past Medical History:  Diagnosis Date  . Abnormal Pap smear of cervix   . Chest pain   . Depression   . Dysplasia of cervix, high grade CIN 2 2016  . GERD (gastroesophageal reflux disease)   . Interstitial cystitis   . Seasonal allergies   . Skin cancer     Past Surgical History:  Procedure Laterality Date  . ABDOMINAL HYSTERECTOMY  2011   [facility]  . CARPAL TUNNEL RELEASE  2009  . CHOLECYSTECTOMY    . CYSTOSCOPY    . DILATION AND CURETTAGE OF UTERUS  2009   [facility]  . LEEP  2016   [facility]    Family History  Problem Relation Age of Onset  . Diabetes Mother   . Heart disease Father   . Hypertension Father   . Colon cancer Maternal Grandmother 59  . Diabetes Maternal Grandmother   . Diabetes Paternal Grandmother   . Diabetes Paternal Grandfather   . Breast cancer Paternal Aunt 92  . Cervical cancer Maternal Aunt     Social History   Socioeconomic History  .  Marital status: Married    Spouse name: Not on file  . Number of children: Not on file  . Years of education: Not on file  . Highest education level: Not on file  Occupational History  . Not on file  Social Needs  . Financial resource strain: Not on file  . Food insecurity    Worry: Not on file    Inability: Not on file  . Transportation needs    Medical: Not on file    Non-medical: Not on file  Tobacco Use  . Smoking status: Never Smoker  . Smokeless tobacco: Never Used  Substance and Sexual Activity  . Alcohol use: No  . Drug use: No  . Sexual activity: Yes    Birth control/protection: Surgical    Comment: Hysterectomy  Lifestyle  . Physical activity    Days per week: Not on file    Minutes per session: Not on file  . Stress: Not on file  Relationships  . Social Herbalist on phone: Not on file    Gets together: Not on file    Attends religious service: Not on file    Active member of club or organization: Not on file    Attends meetings of clubs  or organizations: Not on file    Relationship status: Not on file  . Intimate partner violence    Fear of current or ex partner: Not on file    Emotionally abused: Not on file    Physically abused: Not on file    Forced sexual activity: Not on file  Other Topics Concern  . Not on file  Social History Narrative  . Not on file    Current Outpatient Medications on File Prior to Visit  Medication Sig Dispense Refill  . ALPRAZolam (XANAX) 0.25 MG tablet     . Cholecalciferol (VITAMIN D3) 2000 UNITS capsule Take 2,000 Units by mouth daily.    . fluticasone (FLONASE) 50 MCG/ACT nasal spray Place into the nose.    . isosorbide mononitrate (IMDUR) 30 MG 24 hr tablet Take 30 mg by mouth daily.    . meloxicam (MOBIC) 15 MG tablet     . montelukast (SINGULAIR) 10 MG tablet Take 10 mg by mouth at bedtime.    . Olopatadine HCl (PAZEO) 0.7 % SOLN TAKE 1-2 DROP(S) IN BOTH EYES ONCE A DAY    . pantoprazole (PROTONIX) 40 MG  tablet Take 40 mg by mouth daily.    . sertraline (ZOLOFT) 100 MG tablet TAKE 1-2 TABLET BY MOUTH DAILY.    Marland Kitchen triamterene-hydrochlorothiazide (MAXZIDE-25) 37.5-25 MG per tablet Take 1 tablet by mouth daily.     No current facility-administered medications on file prior to visit.       ROS:  Review of Systems  Constitutional: Negative for fatigue, fever and unexpected weight change.  Respiratory: Negative for cough, shortness of breath and wheezing.   Cardiovascular: Negative for chest pain, palpitations and leg swelling.  Gastrointestinal: Negative for blood in stool, constipation, diarrhea, nausea and vomiting.  Endocrine: Negative for cold intolerance, heat intolerance and polyuria.  Genitourinary: Negative for dyspareunia, dysuria, flank pain, frequency, genital sores, hematuria, menstrual problem, pelvic pain, urgency, vaginal bleeding, vaginal discharge and vaginal pain.  Musculoskeletal: Negative for back pain, joint swelling and myalgias.  Skin: Negative for rash.  Neurological: Negative for dizziness, syncope, light-headedness, numbness and headaches.  Hematological: Negative for adenopathy.  Psychiatric/Behavioral: Negative for agitation, confusion, sleep disturbance and suicidal ideas. The patient is not nervous/anxious.      Objective: BP 108/68   Pulse 75   Ht 5\' 7"  (1.702 m)   Wt 171 lb (77.6 kg)   SpO2 98%   BMI 26.78 kg/m    Physical Exam Constitutional:      Appearance: She is well-developed.  Genitourinary:     Vulva, vagina, cervix, right adnexa and left adnexa normal.     No vaginal discharge, erythema or tenderness.     Uterus is absent.     No right or left adnexal mass present.     Right adnexa not tender.     Left adnexa not tender.     Genitourinary Comments: UTERUS SURG REM  Neck:     Musculoskeletal: Normal range of motion.     Thyroid: No thyromegaly.  Cardiovascular:     Rate and Rhythm: Normal rate and regular rhythm.     Heart sounds:  Normal heart sounds. No murmur.  Pulmonary:     Effort: Pulmonary effort is normal.     Breath sounds: Normal breath sounds.  Chest:     Breasts:        Right: No mass, nipple discharge, skin change or tenderness.        Left: No mass, nipple  discharge, skin change or tenderness.  Abdominal:     Palpations: Abdomen is soft.     Tenderness: There is no abdominal tenderness. There is no guarding.  Musculoskeletal: Normal range of motion.  Neurological:     General: No focal deficit present.     Mental Status: She is alert and oriented to person, place, and time.     Cranial Nerves: No cranial nerve deficit.  Skin:    General: Skin is warm and dry.  Psychiatric:        Mood and Affect: Mood normal.        Behavior: Behavior normal.        Thought Content: Thought content normal.        Judgment: Judgment normal.  Vitals signs reviewed.     Assessment/Plan: Encounter for annual routine gynecological examination -   Cervical cancer screening - Plan: IGP, Aptima HPV,   Screening for HPV (human papillomavirus) - Plan: IGP, Aptima HPV,  Dysplasia of cervix, high grade CIN 2 - Plan: IGP, Aptima HPV,   Screening for breast cancer - Plan Pt current on mammo           GYN counsel breast self exam, mammography screening, adequate intake of calcium and vitamin D, diet and exercise     F/U  Return in about 1 year (around 11/02/2019).   B. , PA-C 11/02/2018 1:50 PM

## 2018-11-02 ENCOUNTER — Encounter: Payer: Self-pay | Admitting: Obstetrics and Gynecology

## 2018-11-02 ENCOUNTER — Other Ambulatory Visit: Payer: Self-pay

## 2018-11-02 ENCOUNTER — Ambulatory Visit (INDEPENDENT_AMBULATORY_CARE_PROVIDER_SITE_OTHER): Payer: Managed Care, Other (non HMO) | Admitting: Obstetrics and Gynecology

## 2018-11-02 VITALS — BP 108/68 | HR 75 | Ht 67.0 in | Wt 171.0 lb

## 2018-11-02 DIAGNOSIS — Z01419 Encounter for gynecological examination (general) (routine) without abnormal findings: Secondary | ICD-10-CM | POA: Diagnosis not present

## 2018-11-02 DIAGNOSIS — Z1239 Encounter for other screening for malignant neoplasm of breast: Secondary | ICD-10-CM

## 2018-11-02 DIAGNOSIS — N871 Moderate cervical dysplasia: Secondary | ICD-10-CM

## 2018-11-02 DIAGNOSIS — Z1151 Encounter for screening for human papillomavirus (HPV): Secondary | ICD-10-CM

## 2018-11-02 DIAGNOSIS — Z124 Encounter for screening for malignant neoplasm of cervix: Secondary | ICD-10-CM

## 2018-11-02 NOTE — Patient Instructions (Signed)
I value your feedback and entrusting us with your care. If you get a [facility] patient survey, I would appreciate you taking the time to let us know about your experience today. Thank you! 

## 2018-11-09 LAB — IGP, APTIMA HPV: HPV Aptima: NEGATIVE

## 2019-07-21 ENCOUNTER — Encounter: Payer: Self-pay | Admitting: Emergency Medicine

## 2019-07-21 ENCOUNTER — Other Ambulatory Visit: Payer: Self-pay

## 2019-07-21 ENCOUNTER — Emergency Department
Admission: EM | Admit: 2019-07-21 | Discharge: 2019-07-21 | Disposition: A | Discharge status: Home / Self Care | Payer: Managed Care, Other (non HMO) | Attending: Emergency Medicine | Admitting: Emergency Medicine

## 2019-07-21 ENCOUNTER — Emergency Department: Payer: Managed Care, Other (non HMO)

## 2019-07-21 DIAGNOSIS — Z79899 Other long term (current) drug therapy: Secondary | ICD-10-CM | POA: Insufficient documentation

## 2019-07-21 DIAGNOSIS — R0789 Other chest pain: Secondary | ICD-10-CM | POA: Insufficient documentation

## 2019-07-21 LAB — BASIC METABOLIC PANEL
Anion gap: 7 (ref 5–15)
BUN: 30 mg/dL — ABNORMAL HIGH (ref 6–20)
CO2: 28 mmol/L (ref 22–32)
Calcium: 8.8 mg/dL — ABNORMAL LOW (ref 8.9–10.3)
Chloride: 104 mmol/L (ref 98–111)
Creatinine, Ser: 0.98 mg/dL (ref 0.44–1.00)
GFR calc Af Amer: >60 mL/min (ref >60)
GFR calc non Af Amer: >60 mL/min (ref >60)
Glucose, Bld: 98 mg/dL (ref 70–99)
Potassium: 3.3 mmol/L — ABNORMAL LOW (ref 3.5–5.1)
Sodium: 139 mmol/L (ref 135–145)

## 2019-07-21 LAB — CBC
HCT: 42.8 % (ref 36.0–46.0)
Hemoglobin: 14.6 g/dL (ref 12.0–15.0)
MCH: 30.8 pg (ref 26.0–34.0)
MCHC: 34.1 g/dL (ref 30.0–36.0)
MCV: 90.3 fL (ref 80.0–100.0)
Platelets: 385 10*3/uL (ref 150–400)
RBC: 4.74 MIL/uL (ref 3.87–5.11)
RDW: 12.1 % (ref 11.5–15.5)
WBC: 9.8 10*3/uL (ref 4.0–10.5)
nRBC: 0 % (ref 0.0–0.2)

## 2019-07-21 LAB — FIBRIN DERIVATIVES D-DIMER (ARMC ONLY): Fibrin derivatives D-dimer (ARMC): 381.53 ng/mL (FEU) (ref 0.00–499.00)

## 2019-07-21 LAB — TROPONIN I (HIGH SENSITIVITY)
Troponin I (High Sensitivity): <2 ng/L (ref <18)
Troponin I (High Sensitivity): <2 ng/L (ref <18)

## 2019-07-21 MED ORDER — TRAMADOL HCL 50 MG PO TABS: 50.0000 mg | ORAL_TABLET | Freq: Four times a day (QID) | ORAL | 0 refills | Status: DC | PRN

## 2019-07-21 NOTE — ED Triage Notes (Signed)
Pt with chest pain since Tuesday that gets worse when she takes a deep breath. Pt denies SOB with exertion. Pain is constant, 3/10 when laying still and 6/10 when deep breathing.

## 2019-07-21 NOTE — ED Provider Notes (Signed)
[facility] Emergency Department Provider Note  Time seen: 2:08 PM  I have reviewed the triage vital signs and the nursing notes.   HISTORY  Chief Complaint Chest Pain   HPI Ann Rhodes is a 44 y.o. female with a past medical history of depression, gastric reflux, presents to the emergency department for left chest pain.  According to the patient for several days now she has been experiencing an intermittent dull pain to the left chest, with a more severe sharp pain to the left chest if she takes a deep breath.  Patient states she has had a similar event occur previously that was thought to be due to a pulled or strained muscle.  Patient took muscle relaxers that seem to help last time.  States she still had muscle relaxers leftover so she tried to take them with this chest discomfort and it did not seem to be helping.  Patient was concerned so she came to the emergency department for evaluation.  Currently the patient appears well, no distress.  Patient states very slight left chest discomfort but describes moderate sharp pain with deep inspiration.  No leg pain or swelling.  No history of DVT.  No estrogen use.  Past Medical History:  Diagnosis Date  . Abnormal Pap smear of cervix   . Chest pain   . Depression   . Dysplasia of cervix, high grade CIN 2 2016  . GERD (gastroesophageal reflux disease)   . Interstitial cystitis   . Seasonal allergies   . Skin cancer     Patient Active Problem List   Diagnosis Date Noted  . Dysplasia of cervix, high grade CIN 2 11/01/2018  . Pain in the chest 11/21/2014  . SOB (shortness of breath) 11/21/2014  . Meniere's disease 11/21/2014  . Depression 11/21/2014    Past Surgical History:  Procedure Laterality Date  . ABDOMINAL HYSTERECTOMY  2011   [facility]  . CARPAL TUNNEL RELEASE  2009  . CHOLECYSTECTOMY    . CYSTOSCOPY    . DILATION AND CURETTAGE OF UTERUS  2009   [facility]  . LEEP  2016   [facility]     Prior to Admission medications   Medication Sig Start Date End Date Taking? Authorizing Provider  ALPRAZolam Duanne Moron) 0.25 MG tablet  06/23/16   [provider]  Cholecalciferol (VITAMIN D3) 2000 UNITS capsule Take 2,000 Units by mouth daily.    [provider]  fluticasone (FLONASE) 50 MCG/ACT nasal spray Place into the nose. 02/18/17   [provider]  isosorbide mononitrate (IMDUR) 30 MG 24 hr tablet Take 30 mg by mouth daily.    [provider]  meloxicam (MOBIC) 15 MG tablet  07/05/16   [provider]  montelukast (SINGULAIR) 10 MG tablet Take 10 mg by mouth at bedtime.    [provider]  Olopatadine HCl (PAZEO) 0.7 % SOLN TAKE 1-2 DROP(S) IN BOTH EYES ONCE A DAY 01/07/17   [provider]  pantoprazole (PROTONIX) 40 MG tablet Take 40 mg by mouth daily.    [provider]  sertraline (ZOLOFT) 100 MG tablet TAKE 1-2 TABLET BY MOUTH DAILY. 09/07/16   [provider]  triamterene-hydrochlorothiazide (MAXZIDE-25) 37.5-25 MG per tablet Take 1 tablet by mouth daily.    [provider]    Allergies  Allergen Reactions  . Amoxicillin-Pot Clavulanate Other (See Comments)    Pt states the medication never works for her.    Family History  Problem Relation  Age of Onset  . Diabetes Mother   . Heart disease Father   . Hypertension Father   . Colon cancer Maternal Grandmother 80  . Diabetes Maternal Grandmother   . Diabetes Paternal Grandmother   . Diabetes Paternal Grandfather   . Breast cancer Paternal Aunt 44  . Cervical cancer Maternal Aunt     Social History Social History   Tobacco Use  . Smoking status: Never Smoker  . Smokeless tobacco: Never Used  Substance Use Topics  . Alcohol use: No  . Drug use: No    Review of Systems Constitutional: Negative for fever. Cardiovascular: Left chest pain Respiratory: Negative for shortness of breath. Gastrointestinal: Negative for abdominal  pain Musculoskeletal: Negative for musculoskeletal complaints Neurological: Negative for headache All other ROS negative  ____________________________________________   PHYSICAL EXAM:  Constitutional: Alert and oriented. Well appearing and in no distress. Eyes: Normal exam ENT      Head: Normocephalic and atraumatic.      Mouth/Throat: Mucous membranes are moist. Cardiovascular: Normal rate, regular rhythm. No murmur.  Chest is nontender to palpation or compression. Respiratory: Normal respiratory effort without tachypnea nor retractions. Breath sounds are clear  Gastrointestinal: Soft and nontender. No distention.  Musculoskeletal: Nontender with normal range of motion in all extremities. No lower extremity tenderness or edema. Neurologic:  Normal speech and language. No gross focal neurologic deficits Skin:  Skin is warm, dry and intact.  Psychiatric: Mood and affect are normal.   ____________________________________________    EKG  EKG viewed and interpreted by myself shows a normal sinus rhythm at 73 bpm with a narrow QRS, normal axis, normal intervals, no concerning ST changes  ____________________________________________    RADIOLOGY  Chest x-ray is negative  ____________________________________________   INITIAL IMPRESSION / ASSESSMENT AND PLAN / ED COURSE  Pertinent labs & imaging results that were available during my care of the patient were reviewed by me and considered in my medical decision making (see chart for details).   Patient presents to the emergency department for left-sided chest pain over the past several days, mild dull pain with sharp pain with deep inspiration.  Overall the patient appears well, chest wall is nontender to palpation.  Blood work is reassuring including a negative troponin.  Chest x-ray is normal and EKG is reassuring.  However given the patient's description of sharp pleuritic chest pain we will obtain a D-dimer.  If D-dimer is  negative I believe the patient could be safely discharged home, if positive we will proceed with CTA.  Patient agreeable to plan of care.  Patient's D-dimer and repeat troponin are both negative.  Patient continues to appear well.  Highly suspect musculoskeletal discomfort.  We will discharge the patient home with PCP follow-up.  JOSI HUMISTON was evaluated in Emergency Department on 07/21/2019 for the symptoms described in the history of present illness. She was evaluated in the context of the global COVID-19 pandemic, which necessitated consideration that the patient might be at risk for infection with the SARS-CoV-2 virus that causes COVID-19. Institutional protocols and algorithms that pertain to the evaluation of patients at risk for COVID-19 are in a state of rapid change based on information released by regulatory bodies including the CDC and federal and state organizations. These policies and algorithms were followed during the patient's care in the ED.  ____________________________________________   FINAL CLINICAL IMPRESSION(S) / ED DIAGNOSES  Left chest pain   Harvest Dark, MD 07/21/19 1519

## 2019-07-27 ENCOUNTER — Ambulatory Visit: Payer: Managed Care, Other (non HMO) | Attending: Internal Medicine

## 2019-07-27 DIAGNOSIS — Z23 Encounter for immunization: Secondary | ICD-10-CM

## 2019-07-27 NOTE — Progress Notes (Signed)
   Covid-19 Vaccination Clinic  Name:  Ann Rhodes    MRN: [id number] DOB: 12/07/1975  07/27/2019  [name] was observed post Covid-19 immunization for 15 minutes without incident. She was provided with Vaccine Information Sheet and instruction to access the V-Safe system.   [name] was instructed to call 911 with any severe reactions post vaccine: Marland Kitchen Difficulty breathing  . Swelling of face and throat  . A fast heartbeat  . A bad rash all over body  . Dizziness and weakness   Immunizations Administered    Name Date Dose VIS Date Route   Pfizer COVID-19 Vaccine 07/27/2019  8:20 AM 0.3 mL 04/21/2019 Intramuscular   Manufacturer: [place]   Lot: [id number]   [place]: [id number]

## 2019-08-07 IMAGING — MG MM DIGITAL SCREENING BILAT W/ TOMO W/ CAD
8 of 12 series · 8 of 28 positions shown · non-contrast
Comparison: Previous exam(s).

CLINICAL DATA: Screening.

EXAM:
DIGITAL SCREENING BILATERAL MAMMOGRAM WITH TOMO AND CAD

[L MLO synth-2D]
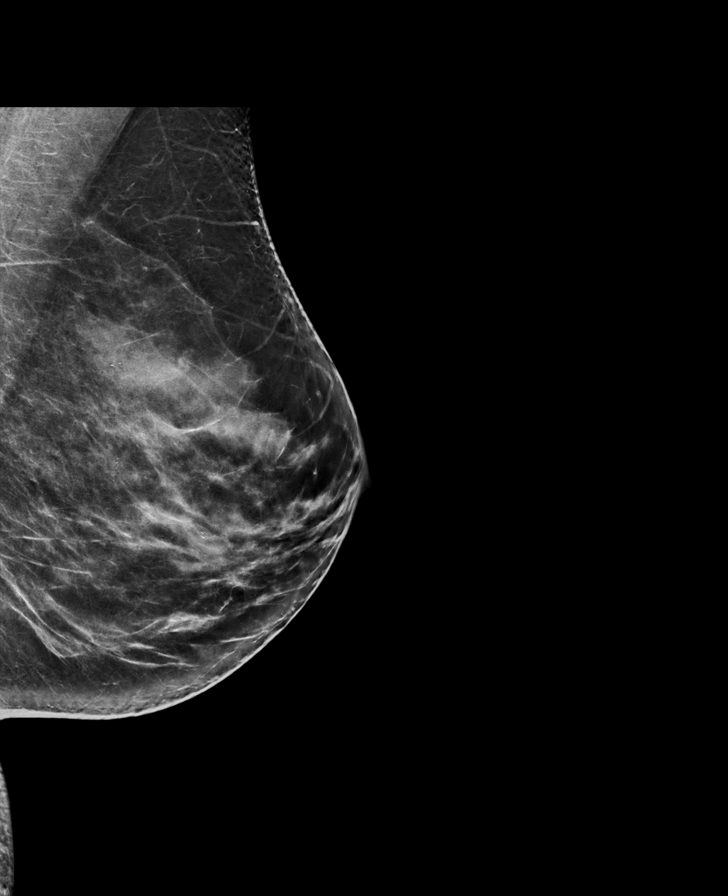

[R CC synth-2D]
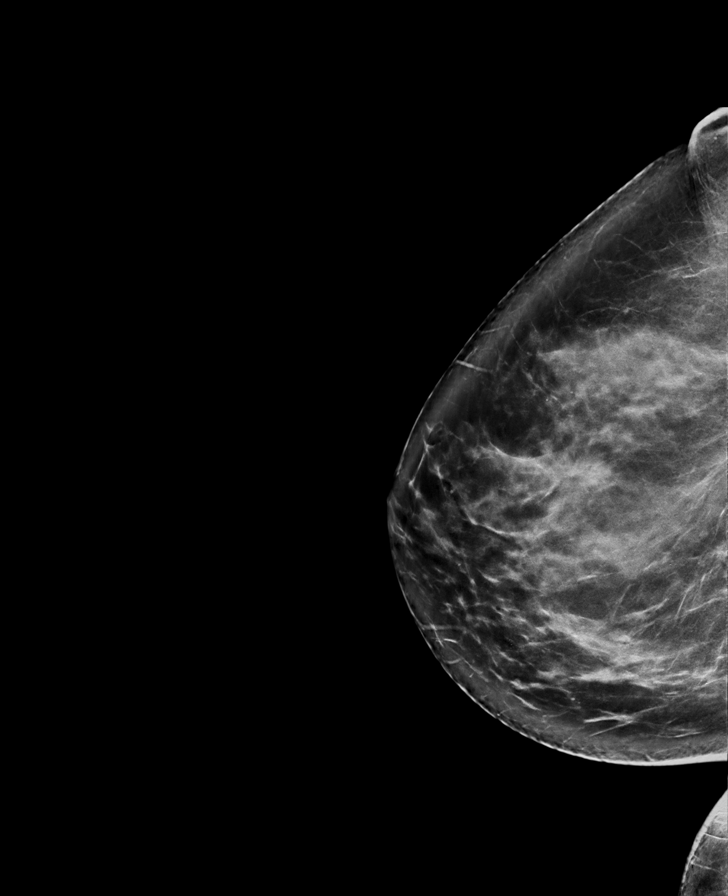

[R MLO synth-2D]
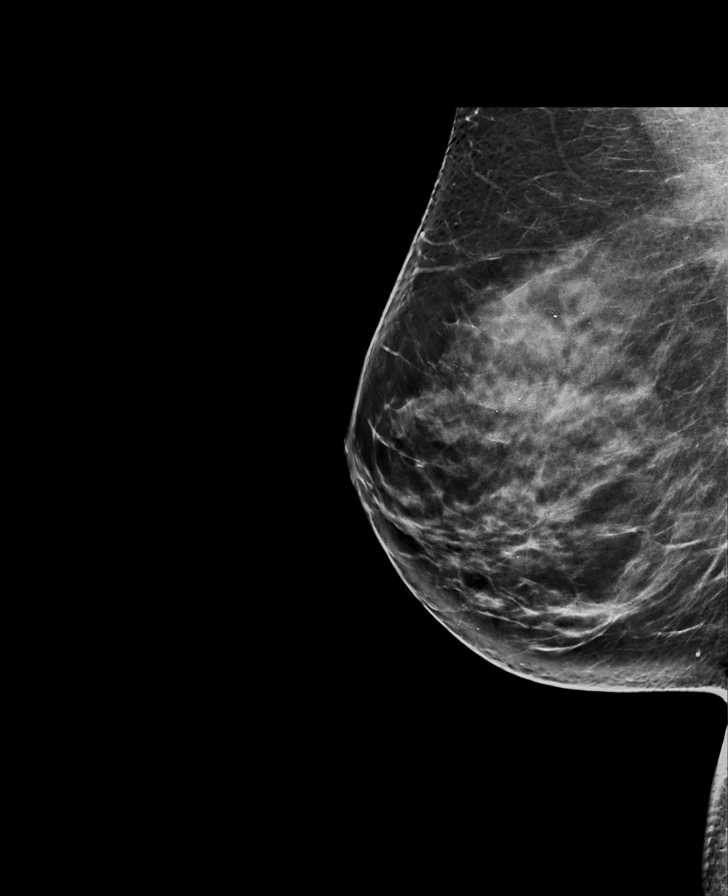

[L CC synth-2D]
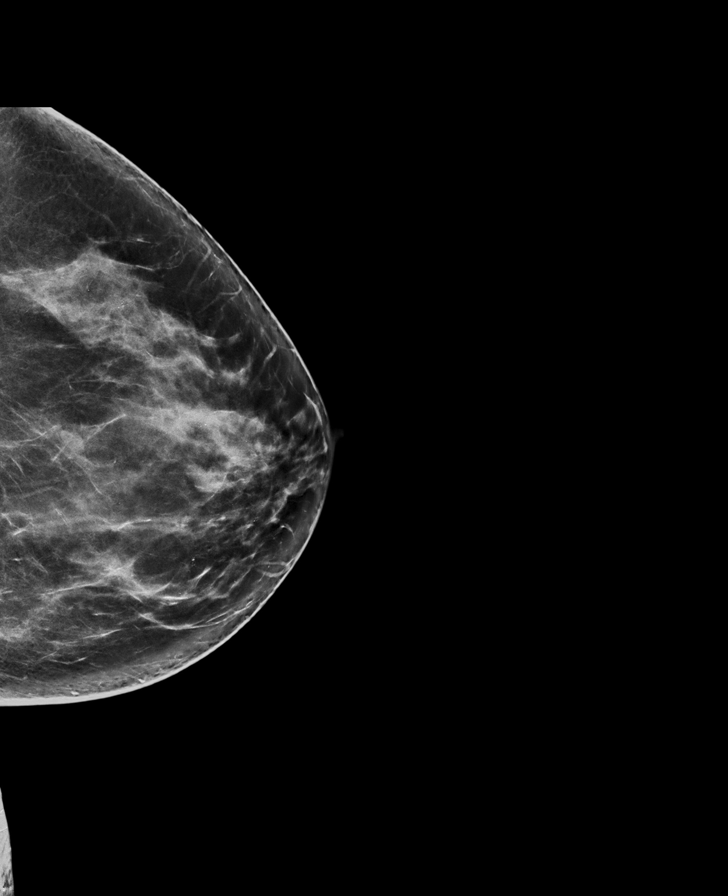

[R MLO (1 of 2)]
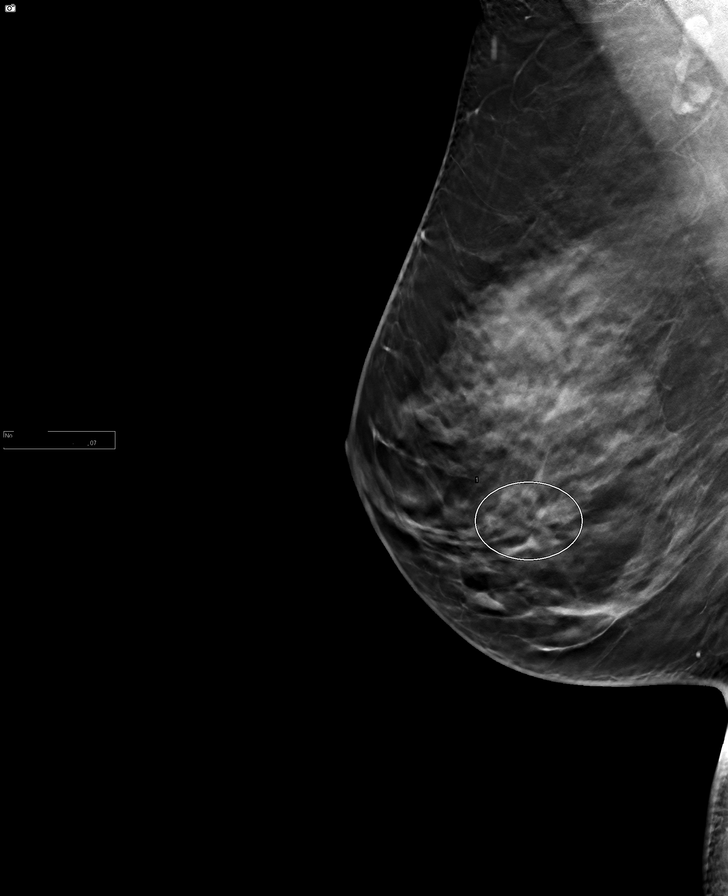

[R MLO (2 of 2)]
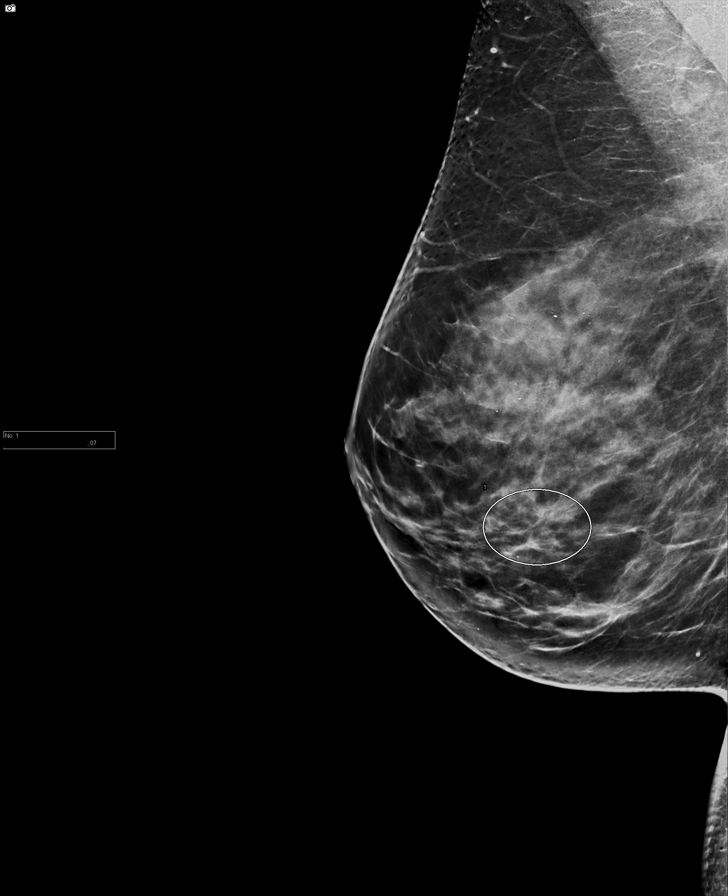

[R CC (1 of 2)]
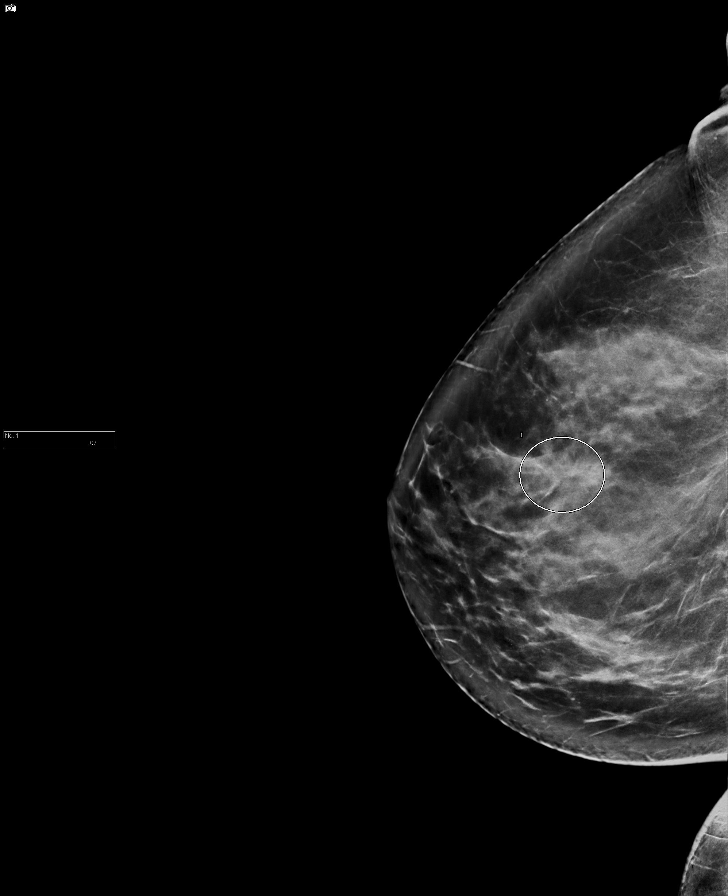

[R CC (2 of 2)]
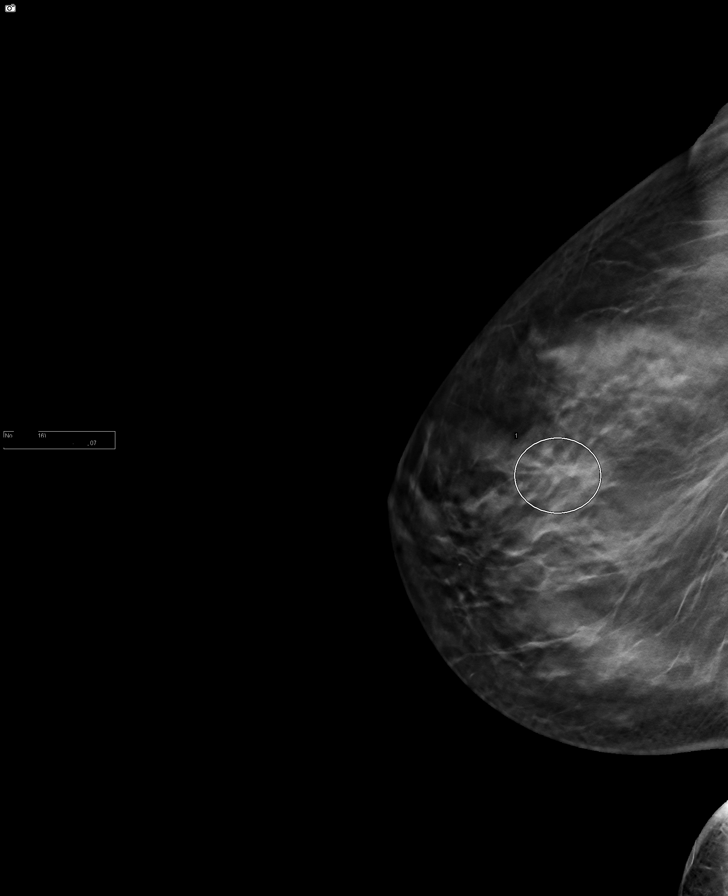

[8 of 28 positions shown; findings below may reference images not displayed]

ACR Breast Density Category c: The breast tissue is heterogeneously
dense, which may obscure small masses.
FINDINGS: In the right breast, possible distortion warrants further
evaluation. In the left breast, no findings suspicious for
malignancy. Images were processed with CAD.
IMPRESSION: Further evaluation is suggested for possible distortion in the right
breast.

RECOMMENDATION:
Diagnostic mammogram and possibly ultrasound of the right breast.
(Code:[id number])

The patient will be contacted regarding the findings, and additional
imaging will be scheduled.

BI-RADS CATEGORY  0: Incomplete. Need additional imaging evaluation
and/or prior mammograms for comparison.

## 2019-08-22 ENCOUNTER — Ambulatory Visit: Payer: Managed Care, Other (non HMO) | Attending: Internal Medicine

## 2019-08-22 DIAGNOSIS — Z23 Encounter for immunization: Secondary | ICD-10-CM

## 2019-08-22 NOTE — Progress Notes (Signed)
   Covid-19 Vaccination Clinic  Name:  Ann Rhodes    MRN: [id number] DOB: Oct 10, 1975  08/22/2019  [name] was observed post Covid-19 immunization for 15 minutes without incident. She was provided with Vaccine Information Sheet and instruction to access the V-Safe system.   Ms. Fails was instructed to call 911 with any severe reactions post vaccine: Marland Kitchen Difficulty breathing  . Swelling of face and throat  . A fast heartbeat  . A bad rash all over body  . Dizziness and weakness   Immunizations Administered    Name Date Dose VIS Date Route   Pfizer COVID-19 Vaccine 08/22/2019  8:11 AM 0.3 mL 04/21/2019 Intramuscular   Manufacturer: [place]   Lot: [id number]   [place]: [id number]

## 2019-11-11 NOTE — Progress Notes (Signed)
Chief Complaint  Patient presents with  . Gynecologic Exam  . [employer] Employee     HPI:      [name] is a 44 y.o. G1P1001 who LMP was No LMP recorded. Patient has had a hysterectomy., presents today for her annual examination. Her menses are absent due to hyst (Lap Supracx Hyst due to adenomyosis). She does not have intermenstrual bleeding.  Sex activity: single partner, contraception - status post hysterectomy.  Last Pap: 11/02/18 Results were: negative cells with NEGATIVE high risk HPV . 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 Repeat pap today. Works at [employer].  Hx of STDs: HPV   Last mammogram: 10/31/18 Results were: normal--routine follow-up in 12 months There is a FH of breast cancer in her pat aunt and prostate cancer in her dad requiring radiation and lupron tx (diagnosed last yr). Genetic testing not done. There is no FH of ovarian cancer. The patient does do self-breast exams.  Tobacco use: The patient denies current or previous tobacco use. Alcohol use: none No drug use Exercise: very active  She does get adequate calcium and Vitamin D in her diet.  Labs with PCP   Past Medical History:  Diagnosis Date  . Abnormal Pap smear of cervix   . Chest pain   . Depression   . Dysplasia of cervix, high grade CIN 2 2016  . GERD (gastroesophageal reflux disease)   . Interstitial cystitis   . Seasonal allergies   . Skin cancer     Past Surgical History:  Procedure Laterality Date  . ABDOMINAL HYSTERECTOMY  2011   [facility]  . CARPAL TUNNEL RELEASE  2009  . CHOLECYSTECTOMY    . CYSTOSCOPY    . DILATION AND CURETTAGE OF UTERUS  2009   [facility]  . LEEP  2016   [facility]    Family History  Problem Relation Age of Onset  . Diabetes Mother   . Heart disease Father   . Hypertension Father   . Prostate cancer Father 23  . Colon cancer Maternal Grandmother 21  . Diabetes Maternal Grandmother   . Diabetes Paternal Grandmother   . Diabetes Paternal  Grandfather   . Breast cancer Paternal Aunt 36  . Cervical cancer Maternal Aunt     Social History   Socioeconomic History  . Marital status: Married    Spouse name: Not on file  . Number of children: Not on file  . Years of education: Not on file  . Highest education level: Not on file  Occupational History  . Not on file  Tobacco Use  . Smoking status: Never Smoker  . Smokeless tobacco: Never Used  Vaping Use  . Vaping Use: Never used  Substance and Sexual Activity  . Alcohol use: No  . Drug use: No  . Sexual activity: Yes    Birth control/protection: Surgical    Comment: Hysterectomy  Other Topics Concern  . Not on file  Social History Narrative  . Not on file   Social Determinants of Health   Financial Resource Strain:   . Difficulty of Paying Living Expenses:   Food Insecurity:   . Worried About Charity fundraiser in the Last Year:   . Arboriculturist in the Last Year:   Transportation Needs:   . Film/video editor (Medical):   Marland Kitchen Lack of Transportation (Non-Medical):   Physical Activity:   . Days of Exercise per Week:   . Minutes  of Exercise per Session:   Stress:   . Feeling of Stress :   Social Connections:   . Frequency of Communication with Friends and Family:   . Frequency of Social Gatherings with Friends and Family:   . Attends Religious Services:   . Active Member of Clubs or Organizations:   . Attends Archivist Meetings:   Marland Kitchen Marital Status:   Intimate Partner Violence:   . Fear of Current or Ex-Partner:   . Emotionally Abused:   Marland Kitchen Physically Abused:   . Sexually Abused:     Current Outpatient Medications on File Prior to Visit  Medication Sig Dispense Refill  . ALPRAZolam (XANAX) 0.25 MG tablet     . Cholecalciferol (VITAMIN D3) 2000 UNITS capsule Take 2,000 Units by mouth daily.    . fluticasone (FLONASE) 50 MCG/ACT nasal spray Place into the nose.    . montelukast (SINGULAIR) 10 MG tablet Take 10 mg by mouth at bedtime.     . Olopatadine HCl (PAZEO) 0.7 % SOLN TAKE 1-2 DROP(S) IN BOTH EYES ONCE A DAY    . pantoprazole (PROTONIX) 40 MG tablet Take 40 mg by mouth daily.    . sertraline (ZOLOFT) 100 MG tablet TAKE 1-2 TABLET BY MOUTH DAILY.    Marland Kitchen triamterene-hydrochlorothiazide (MAXZIDE-25) 37.5-25 MG per tablet Take 1 tablet by mouth daily.     No current facility-administered medications on file prior to visit.      ROS:  Review of Systems  Constitutional: Negative for fatigue, fever and unexpected weight change.  Respiratory: Negative for cough, shortness of breath and wheezing.   Cardiovascular: Negative for chest pain, palpitations and leg swelling.  Gastrointestinal: Negative for blood in stool, constipation, diarrhea, nausea and vomiting.  Endocrine: Negative for cold intolerance, heat intolerance and polyuria.  Genitourinary: Negative for dyspareunia, dysuria, flank pain, frequency, genital sores, hematuria, menstrual problem, pelvic pain, urgency, vaginal bleeding, vaginal discharge and vaginal pain.  Musculoskeletal: Negative for back pain, joint swelling and myalgias.  Skin: Negative for rash.  Neurological: Negative for dizziness, syncope, light-headedness, numbness and headaches.  Hematological: Negative for adenopathy.  Psychiatric/Behavioral: Negative for agitation, confusion, sleep disturbance and suicidal ideas. The patient is not nervous/anxious.      Objective: BP 108/80   Ht 5' 6" (1.676 m)   Wt 173 lb (78.5 kg)   BMI 27.92 kg/m    Physical Exam Constitutional:      Appearance: She is well-developed.  Genitourinary:     Vulva, vagina, cervix, right adnexa and left adnexa normal.     No vaginal discharge, erythema or tenderness.     Uterus is absent.     No right or left adnexal mass present.     Right adnexa not tender.     Left adnexa not tender.     Genitourinary Comments: UTERUS SURG REM  Neck:     Thyroid: No thyromegaly.  Cardiovascular:     Rate and Rhythm:  Normal rate and regular rhythm.     Heart sounds: Normal heart sounds. No murmur heard.   Pulmonary:     Effort: Pulmonary effort is normal.     Breath sounds: Normal breath sounds.  Chest:     Breasts:        Right: No mass, nipple discharge, skin change or tenderness.        Left: No mass, nipple discharge, skin change or tenderness.  Abdominal:     Palpations: Abdomen is soft.     Tenderness: There  is no abdominal tenderness. There is no guarding.  Musculoskeletal:        General: Normal range of motion.     Cervical back: Normal range of motion.  Neurological:     General: No focal deficit present.     Mental Status: She is alert and oriented to person, place, and time.     Cranial Nerves: No cranial nerve deficit.  Skin:    General: Skin is warm and dry.  Psychiatric:        Mood and Affect: Mood normal.        Behavior: Behavior normal.        Thought Content: Thought content normal.        Judgment: Judgment normal.  Vitals reviewed.     Assessment/Plan: Encounter for annual routine gynecological examination  Cervical cancer screening - Plan: IGP, Aptima HPV  Screening for HPV (human papillomavirus) - Plan: IGP, Aptima HPV  Dysplasia of cervix, high grade CIN 2 - Plan: IGP, Aptima HPV; repeat today.  Encounter for screening mammogram for malignant neoplasm of breast - Plan: MM 3D SCREEN BREAST BILATERAL; pt to sched mammo  Family history of breast cancer - Plan: Integrated BRACAnalysis ([place])  Family history of prostate cancer in father - Plan: Engineer, water ([place]); MyRisk testing discussed and done today. Will call with results.    GYN counsel breast self exam, mammography screening, adequate intake of calcium and vitamin D, diet and exercise     F/U  Return in about 1 year (around 11/13/2020).  Grisela Mesch B. Uzziel Russey, PA-C 11/14/2019 5:01 PM

## 2019-11-14 ENCOUNTER — Encounter: Payer: Self-pay | Admitting: Obstetrics and Gynecology

## 2019-11-14 ENCOUNTER — Other Ambulatory Visit: Payer: Self-pay

## 2019-11-14 ENCOUNTER — Ambulatory Visit (INDEPENDENT_AMBULATORY_CARE_PROVIDER_SITE_OTHER): Payer: Managed Care, Other (non HMO) | Admitting: Obstetrics and Gynecology

## 2019-11-14 VITALS — BP 108/80 | Ht 66.0 in | Wt 173.0 lb

## 2019-11-14 DIAGNOSIS — Z8042 Family history of malignant neoplasm of prostate: Secondary | ICD-10-CM

## 2019-11-14 DIAGNOSIS — N871 Moderate cervical dysplasia: Secondary | ICD-10-CM | POA: Diagnosis not present

## 2019-11-14 DIAGNOSIS — Z1151 Encounter for screening for human papillomavirus (HPV): Secondary | ICD-10-CM

## 2019-11-14 DIAGNOSIS — Z124 Encounter for screening for malignant neoplasm of cervix: Secondary | ICD-10-CM

## 2019-11-14 DIAGNOSIS — Z01419 Encounter for gynecological examination (general) (routine) without abnormal findings: Secondary | ICD-10-CM | POA: Diagnosis not present

## 2019-11-14 DIAGNOSIS — Z1231 Encounter for screening mammogram for malignant neoplasm of breast: Secondary | ICD-10-CM

## 2019-11-14 DIAGNOSIS — Z803 Family history of malignant neoplasm of breast: Secondary | ICD-10-CM | POA: Insufficient documentation

## 2019-11-14 NOTE — Patient Instructions (Signed)
I value your feedback and entrusting us with your care. If you get a [facility] patient survey, I would appreciate you taking the time to let us know about your experience today. Thank you! ° °As of April 20, 2019, your lab results will be released to your MyChart immediately, before I even have a chance to see them. Please give me time to review them and contact you if there are any abnormalities. Thank you for your patience.  ° °[facility]: [phone] ° ° ° °

## 2019-11-17 LAB — IGP, APTIMA HPV: HPV Aptima: NEGATIVE

## 2019-11-27 ENCOUNTER — Ambulatory Visit
Admission: RE | Admit: 2019-11-27 | Discharge: 2019-11-27 | Disposition: A | Discharge status: Home / Self Care | Payer: Managed Care, Other (non HMO) | Source: Ambulatory Visit | Attending: Obstetrics and Gynecology | Admitting: Obstetrics and Gynecology

## 2019-11-27 DIAGNOSIS — Z1231 Encounter for screening mammogram for malignant neoplasm of breast: Secondary | ICD-10-CM | POA: Diagnosis present

## 2019-11-28 ENCOUNTER — Encounter: Payer: Self-pay | Admitting: Obstetrics and Gynecology

## 2019-12-10 DIAGNOSIS — Z803 Family history of malignant neoplasm of breast: Secondary | ICD-10-CM

## 2019-12-10 DIAGNOSIS — Z1371 Encounter for nonprocreative screening for genetic disease carrier status: Secondary | ICD-10-CM

## 2019-12-10 HISTORY — DX: Family history of malignant neoplasm of breast: Z80.3

## 2019-12-10 HISTORY — DX: Encounter for nonprocreative screening for genetic disease carrier status: Z13.71

## 2019-12-14 ENCOUNTER — Ambulatory Visit (INDEPENDENT_AMBULATORY_CARE_PROVIDER_SITE_OTHER): Payer: Managed Care, Other (non HMO) | Admitting: Dermatology

## 2019-12-14 ENCOUNTER — Other Ambulatory Visit: Payer: Self-pay

## 2019-12-14 ENCOUNTER — Encounter: Payer: Self-pay | Admitting: Dermatology

## 2019-12-14 DIAGNOSIS — B079 Viral wart, unspecified: Secondary | ICD-10-CM | POA: Diagnosis not present

## 2019-12-14 DIAGNOSIS — Z85828 Personal history of other malignant neoplasm of skin: Secondary | ICD-10-CM

## 2019-12-14 DIAGNOSIS — D229 Melanocytic nevi, unspecified: Secondary | ICD-10-CM

## 2019-12-14 DIAGNOSIS — L905 Scar conditions and fibrosis of skin: Secondary | ICD-10-CM

## 2019-12-14 DIAGNOSIS — Z1283 Encounter for screening for malignant neoplasm of skin: Secondary | ICD-10-CM | POA: Diagnosis not present

## 2019-12-14 DIAGNOSIS — D1800 Hemangioma unspecified site: Secondary | ICD-10-CM

## 2019-12-14 DIAGNOSIS — D239 Other benign neoplasm of skin, unspecified: Secondary | ICD-10-CM

## 2019-12-14 DIAGNOSIS — D2272 Melanocytic nevi of left lower limb, including hip: Secondary | ICD-10-CM

## 2019-12-14 DIAGNOSIS — D2371 Other benign neoplasm of skin of right lower limb, including hip: Secondary | ICD-10-CM | POA: Diagnosis not present

## 2019-12-14 DIAGNOSIS — L814 Other melanin hyperpigmentation: Secondary | ICD-10-CM

## 2019-12-14 DIAGNOSIS — L578 Other skin changes due to chronic exposure to nonionizing radiation: Secondary | ICD-10-CM

## 2019-12-14 DIAGNOSIS — L821 Other seborrheic keratosis: Secondary | ICD-10-CM

## 2019-12-14 NOTE — Progress Notes (Signed)
   Follow-Up Visit   Subjective  Ann Rhodes is a 44 y.o. female who presents for the following: full body skin exam and skin cancer screening  Patient presents today for annual TBSE, has no concerns at this time, has a skin cancer history  The following portions of the chart were reviewed this encounter and updated as appropriate:  Tobacco  Allergies  Meds  Problems  Med Hx  Surg Hx  Fam Hx      Review of Systems:  No other skin or systemic complaints except as noted in HPI or Assessment and Plan.  Objective  Well appearing patient in no apparent distress; mood and affect are within normal limits.  A full examination was performed including scalp, head, eyes, ears, nose, lips, neck, chest, axillae, abdomen, back, buttocks, bilateral upper extremities, bilateral lower extremities, hands, feet, fingers, toes, fingernails, and toenails. All findings within normal limits unless otherwise noted below.  Objective  Left Palmar Proximal Thumb: Verrucous papules  Objective  Left Medial Plantar Surface: 0.4 cm brown macule  Objective  Right Medial Thigh: Firm pink/brown papulenodule with dimple sign.   Objective  Mid Upper  Back, Right ingunial fold: Dyspigmented smooth macule or patch.    Assessment & Plan  Viral warts, unspecified type Left Palmar Proximal Thumb  Discussed viral etiology and risk of spread.  Discussed multiple treatments may be required to clear warts.    Patient defers treatment today  Nevus Left Medial Plantar Surface  Benign-appearing.  Observation.  Call clinic for new or changing moles.  Recommend daily use of broad spectrum spf 30+ sunscreen to sun-exposed areas.    Dermatofibroma Right Medial Thigh  Benign-appearing.  Observation.  Call clinic for new or changing lesions.  Recommend daily use of broad spectrum spf 30+ sunscreen to sun-exposed areas.    Scar (2) Right ingunial fold; Mid Upper  Back  Benign, observe.  Discussed ILK  injection for scar on mid back, patient defers at this time   Lentigines - Scattered tan macules - Discussed due to sun exposure - Benign, observe - Call for any changes  Seborrheic Keratoses - Stuck-on, waxy, tan-brown papules and plaques  - Discussed benign etiology and prognosis. - Observe - Call for any changes  Melanocytic Nevi - Tan-brown and/or pink-flesh-colored symmetric macules and papules - Benign appearing on exam today - Observation - Call clinic for new or changing moles - Recommend daily use of broad spectrum spf 30+ sunscreen to sun-exposed areas.   Hemangiomas - Red papules - Discussed benign nature - Observe - Call for any changes  Actinic Damage - diffuse scaly erythematous macules with underlying dyspigmentation - Recommend daily broad spectrum sunscreen SPF 30+ to sun-exposed areas, reapply every 2 hours as needed.  - Call for new or changing lesions.  Skin cancer screening performed today.  History of unknown type of skin cancer - No evidence of recurrence today - No lymphadenopathy - Recommend regular full body skin exams - Recommend daily broad spectrum sunscreen SPF 30+ to sun-exposed areas, reapply every 2 hours as needed.  - Call if any new or changing lesions are noted between office visits  Return in about 1 year (around 12/13/2020) for TBSE.  I, Donzetta Kohut, CMA, am acting as scribe for Forest Gleason, MD .  Documentation: I have reviewed the above documentation for accuracy and completeness, and I agree with the above.  Forest Gleason, MD

## 2019-12-14 NOTE — Patient Instructions (Addendum)
Recommend daily broad spectrum sunscreen SPF 30+ to sun-exposed areas, reapply every 2 hours as needed. Call for new or changing lesions.  Discussed viral etiology and risk of spread.  Discussed multiple treatments may be required to clear warts.  Discussed possible post-treatment dyspigmentation and risk of recurrence.

## 2019-12-31 ENCOUNTER — Encounter: Payer: Self-pay | Admitting: Dermatology

## 2020-01-24 ENCOUNTER — Encounter: Payer: Self-pay | Admitting: Obstetrics and Gynecology

## 2020-01-24 ENCOUNTER — Telehealth: Payer: Self-pay | Admitting: Obstetrics and Gynecology

## 2020-01-24 NOTE — Telephone Encounter (Signed)
Pt aware of neg MyRisk results. IBIS=9.5%/riskscore=13.8%. No further breast screening recommended at this time. Pt to cont yearly CBE and mammos.  Patient understands these results only apply to her and her children, and this is not indicative of genetic testing results of her other family members. It is recommended that her other family members have genetic testing done.  Pt also understands negative genetic testing doesn't mean she will never get any of these cancers.   Hard copy mailed to pt. F/u prn.

## 2020-09-27 ENCOUNTER — Other Ambulatory Visit: Payer: Self-pay | Admitting: Obstetrics and Gynecology

## 2020-09-27 ENCOUNTER — Other Ambulatory Visit: Payer: Self-pay | Admitting: Family Medicine

## 2020-09-27 DIAGNOSIS — Z1231 Encounter for screening mammogram for malignant neoplasm of breast: Secondary | ICD-10-CM

## 2020-11-14 NOTE — Progress Notes (Signed)
Chief Complaint  Patient presents with   Gynecologic Exam    No concerns     HPI:      [name] is a 45 y.o. G1P1001 who LMP was No LMP recorded. Patient has had a hysterectomy., presents today for her annual examination. Her menses are absent due to hyst (Lap Supracx Hyst due to adenomyosis). She does not have PMB. Occas night sweats.   Sex activity: single partner, contraception - status post hysterectomy.  Last Pap: 11/14/19 and 11/02/18 Results were: negative cells with NEGATIVE high risk HPV. 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 Repeat pap today. Works at [employer].  Hx of STDs: HPV    Last mammogram: 11/27/19 Results were: normal--routine follow-up in 12 months. Has appt 7/22 There is a FH of breast cancer in her pat aunt and prostate cancer in her dad requiring radiation and lupron tx (diagnosed last yr). MyRisk neg 2021, IBIS=9.5%/riskscore=13.8%. There is no FH of ovarian cancer. The patient does self-breast exams.   Tobacco use: The patient denies current or previous tobacco use. Alcohol use: none No drug use Exercise: occas active now due to broken tailbone   She does get adequate calcium and Vitamin D in her diet.   Labs with PCP   Past Medical History:  Diagnosis Date   Abnormal Pap smear of cervix    BRCA negative 12/2019   MyRisk neg   Chest pain    Depression    Dysplasia of cervix, high grade CIN 2 2016   Family history of breast cancer 12/2019   IBIS=9.5%/riskscore=13.8%   Family history of ovarian cancer    GERD (gastroesophageal reflux disease)    Interstitial cystitis    Seasonal allergies    Skin cancer     Past Surgical History:  Procedure Laterality Date   ABDOMINAL HYSTERECTOMY  2011   [facility]   CARPAL TUNNEL RELEASE  2009   CHOLECYSTECTOMY     CYSTOSCOPY     DILATION AND CURETTAGE OF UTERUS  2009   [facility]   LEEP  2016   [facility]    Family History  Problem Relation Age of Onset   Diabetes Mother    Heart disease  Father    Hypertension Father    Prostate cancer Father 52       with radiation and hormone tx; no mets   Colon cancer Maternal Grandmother 41   Diabetes Maternal Grandmother    Diabetes Paternal Grandmother    Diabetes Paternal Grandfather    Breast cancer Paternal Aunt 33   Cervical cancer Maternal Aunt    Ovarian cancer Paternal Aunt 57    Social History   Socioeconomic History   Marital status: Married    Spouse name: Not on file   Number of children: Not on file   Years of education: Not on file   Highest education level: Not on file  Occupational History   Not on file  Tobacco Use   Smoking status: Never   Smokeless tobacco: Never  Vaping Use   Vaping Use: Never used  Substance and Sexual Activity   Alcohol use: No   Drug use: No   Sexual activity: Yes    Birth control/protection: Surgical    Comment: Hysterectomy  Other Topics Concern   Not on file  Social History Narrative   Not on file   Social Determinants of Health   Financial Resource Strain: Not on file  Food Insecurity: Not on  file  Transportation Needs: Not on file  Physical Activity: Not on file  Stress: Not on file  Social Connections: Not on file  Intimate Partner Violence: Not on file    Current Outpatient Medications on File Prior to Visit  Medication Sig Dispense Refill   ALPRAZolam (XANAX) 0.25 MG tablet      Cholecalciferol (VITAMIN D3) 2000 UNITS capsule Take 2,000 Units by mouth daily.     doxycycline (VIBRAMYCIN) 100 MG capsule Take 100 mg by mouth 2 (two) times daily.     fluticasone (FLONASE) 50 MCG/ACT nasal spray Place into the nose.     meloxicam (MOBIC) 15 MG tablet Take 15 mg by mouth daily.     montelukast (SINGULAIR) 10 MG tablet Take 10 mg by mouth at bedtime.     Olopatadine HCl 0.7 % SOLN TAKE 1-2 DROP(S) IN BOTH EYES ONCE A DAY     pantoprazole (PROTONIX) 40 MG tablet Take 40 mg by mouth daily.     predniSONE (DELTASONE) 20 MG tablet Take 20 mg by mouth 2 (two) times  daily.     sertraline (ZOLOFT) 100 MG tablet TAKE 1-2 TABLET BY MOUTH DAILY.     triamterene-hydrochlorothiazide (MAXZIDE-25) 37.5-25 MG per tablet Take 1 tablet by mouth daily.     No current facility-administered medications on file prior to visit.      ROS:  Review of Systems  Constitutional:  Negative for fatigue, fever and unexpected weight change.  Respiratory:  Negative for cough, shortness of breath and wheezing.   Cardiovascular:  Negative for chest pain, palpitations and leg swelling.  Gastrointestinal:  Negative for blood in stool, constipation, diarrhea, nausea and vomiting.  Endocrine: Negative for cold intolerance, heat intolerance and polyuria.  Genitourinary:  Negative for dyspareunia, dysuria, flank pain, frequency, genital sores, hematuria, menstrual problem, pelvic pain, urgency, vaginal bleeding, vaginal discharge and vaginal pain.  Musculoskeletal:  Negative for back pain, joint swelling and myalgias.  Skin:  Negative for rash.  Neurological:  Negative for dizziness, syncope, light-headedness, numbness and headaches.  Hematological:  Negative for adenopathy.  Psychiatric/Behavioral:  Negative for agitation, confusion, sleep disturbance and suicidal ideas. The patient is not nervous/anxious.     Objective: BP 120/70   Ht _0  (1.702 m)   Wt 176 lb (79.8 kg)   BMI 27.57 kg/m    Physical Exam Constitutional:      Appearance: She is well-developed.  Genitourinary:     Vulva normal.     Genitourinary Comments: UTERUS/CX SURG REM     Right Labia: No rash, tenderness or lesions.    Left Labia: No tenderness, lesions or rash.    Vaginal cuff intact.    No vaginal discharge, erythema or tenderness.      Right Adnexa: not tender and no mass present.    Left Adnexa: not tender and no mass present.    Cervix is not absent.     Uterus is absent.  Breasts:    Right: No mass, nipple discharge, skin change or tenderness.     Left: No mass, nipple discharge,  skin change or tenderness.  Neck:     Thyroid: No thyromegaly.  Cardiovascular:     Rate and Rhythm: Normal rate and regular rhythm.     Heart sounds: Normal heart sounds. No murmur heard. Pulmonary:     Effort: Pulmonary effort is normal.     Breath sounds: Normal breath sounds.  Abdominal:     Palpations: Abdomen is soft.  Tenderness: There is no abdominal tenderness. There is no guarding.  Musculoskeletal:        General: Normal range of motion.     Cervical back: Normal range of motion.  Neurological:     General: No focal deficit present.     Mental Status: She is alert and oriented to person, place, and time.     Cranial Nerves: No cranial nerve deficit.  Skin:    General: Skin is warm and dry.  Psychiatric:        Mood and Affect: Mood normal.        Behavior: Behavior normal.        Thought Content: Thought content normal.        Judgment: Judgment normal.  Vitals reviewed.    Assessment/Plan: Encounter for annual routine gynecological examination  Cervical cancer screening - Plan: IGP, Aptima HPV  Screening for HPV (human papillomavirus) - Plan: IGP, Aptima HPV  Dysplasia of cervix, high grade CIN 2 - Plan: IGP, Aptima HPV; repeat pap today.   Encounter for screening mammogram for malignant neoplasm of breast--pt has mammo appt   GYN counsel breast self exam, mammography screening, adequate intake of calcium and vitamin D, diet and exercise     F/U  Return in about 1 year (around 11/15/2021).  Rahmir Beever B. Alyrica Thurow, PA-C 11/15/2020 8:53 AM

## 2020-11-15 ENCOUNTER — Other Ambulatory Visit: Payer: Self-pay

## 2020-11-15 ENCOUNTER — Ambulatory Visit (INDEPENDENT_AMBULATORY_CARE_PROVIDER_SITE_OTHER): Payer: Managed Care, Other (non HMO) | Admitting: Obstetrics and Gynecology

## 2020-11-15 ENCOUNTER — Encounter: Payer: Self-pay | Admitting: Obstetrics and Gynecology

## 2020-11-15 VITALS — BP 120/70 | Ht 67.0 in | Wt 176.0 lb

## 2020-11-15 DIAGNOSIS — Z1231 Encounter for screening mammogram for malignant neoplasm of breast: Secondary | ICD-10-CM

## 2020-11-15 DIAGNOSIS — Z124 Encounter for screening for malignant neoplasm of cervix: Secondary | ICD-10-CM | POA: Diagnosis not present

## 2020-11-15 DIAGNOSIS — Z01419 Encounter for gynecological examination (general) (routine) without abnormal findings: Secondary | ICD-10-CM

## 2020-11-15 DIAGNOSIS — Z1151 Encounter for screening for human papillomavirus (HPV): Secondary | ICD-10-CM

## 2020-11-15 DIAGNOSIS — N871 Moderate cervical dysplasia: Secondary | ICD-10-CM | POA: Diagnosis not present

## 2020-11-15 NOTE — Patient Instructions (Signed)
I value your feedback and you entrusting us with your care. If you get a [facility] patient survey, I would appreciate you taking the time to let us know about your experience today. Thank you! ? ? ?

## 2020-11-18 LAB — IGP, APTIMA HPV: HPV Aptima: NEGATIVE

## 2020-11-27 ENCOUNTER — Ambulatory Visit
Admission: RE | Admit: 2020-11-27 | Discharge: 2020-11-27 | Disposition: A | Discharge status: Home / Self Care | Payer: Managed Care, Other (non HMO) | Source: Ambulatory Visit | Attending: Family Medicine | Admitting: Family Medicine

## 2020-11-27 ENCOUNTER — Other Ambulatory Visit: Payer: Self-pay

## 2020-11-27 DIAGNOSIS — Z1231 Encounter for screening mammogram for malignant neoplasm of breast: Secondary | ICD-10-CM | POA: Diagnosis not present

## 2020-12-19 ENCOUNTER — Ambulatory Visit: Payer: Managed Care, Other (non HMO) | Admitting: Dermatology

## 2020-12-19 ENCOUNTER — Encounter: Payer: Self-pay | Admitting: Dermatology

## 2020-12-19 ENCOUNTER — Other Ambulatory Visit: Payer: Self-pay

## 2020-12-19 DIAGNOSIS — Z85828 Personal history of other malignant neoplasm of skin: Secondary | ICD-10-CM | POA: Diagnosis not present

## 2020-12-19 DIAGNOSIS — L309 Dermatitis, unspecified: Secondary | ICD-10-CM

## 2020-12-19 DIAGNOSIS — D229 Melanocytic nevi, unspecified: Secondary | ICD-10-CM

## 2020-12-19 DIAGNOSIS — L910 Hypertrophic scar: Secondary | ICD-10-CM | POA: Diagnosis not present

## 2020-12-19 DIAGNOSIS — D1800 Hemangioma unspecified site: Secondary | ICD-10-CM

## 2020-12-19 DIAGNOSIS — Z1283 Encounter for screening for malignant neoplasm of skin: Secondary | ICD-10-CM

## 2020-12-19 DIAGNOSIS — L814 Other melanin hyperpigmentation: Secondary | ICD-10-CM

## 2020-12-19 DIAGNOSIS — L821 Other seborrheic keratosis: Secondary | ICD-10-CM

## 2020-12-19 DIAGNOSIS — L578 Other skin changes due to chronic exposure to nonionizing radiation: Secondary | ICD-10-CM

## 2020-12-19 DIAGNOSIS — D2371 Other benign neoplasm of skin of right lower limb, including hip: Secondary | ICD-10-CM

## 2020-12-19 MED ORDER — TRIAMCINOLONE ACETONIDE 0.1 % EX CREA: TOPICAL_CREAM | CUTANEOUS | 1 refills | Status: DC

## 2020-12-19 NOTE — Progress Notes (Signed)
Follow-Up Visit   Subjective  Ann Rhodes is a 45 y.o. female who presents for the following: Annual Exam (Patient here today for 1 year tbse. Patient states she had a itchy place between breast that bothered patient for months she would like checked. It is not currently bothering her, she reports today. She also has a itchy rash at tops of both feet. Patient reports it comes and goes. Not currently itchy today. ).  She has a history of skin cancer vs dysplastic nevi at back from > 10 years ago. Unable to obtain records or pathology reports. The spots were biopsied and then excised. She denies frequently skin exams multiple times a year after this so melanoma unlikely.  Patient here for full body skin exam and skin cancer screening.  The following portions of the chart were reviewed this encounter and updated as appropriate:  Tobacco  Allergies  Meds  Problems  Med Hx  Surg Hx  Fam Hx       Objective  Well appearing patient in no apparent distress; mood and affect are within normal limits.  A full examination was performed including scalp, head, eyes, ears, nose, lips, neck, chest, axillae, abdomen, back, buttocks, bilateral upper extremities, bilateral lower extremities, hands, feet, fingers, toes, fingernails, and toenails. All findings within normal limits unless otherwise noted below.  bilateral feet Faint erythematous patches at dorsal feet, plantar feet between toes and at nails clear  nasal bridge and back Thickened scar at nasal bridge  Assessment & Plan  Eczema, unspecified type bilateral feet  Favor eczema  Start triamcinolone 0.1 % cream - apply topically to affected areas of feet twice a day for 2 weeks. Avoid applying to face, groin, and axilla. Use as directed. 80 g 1 rf  Topical steroids (such as triamcinolone, fluocinolone, fluocinonide, mometasone, clobetasol, halobetasol, betamethasone, hydrocortisone) can cause thinning and lightening of the skin if  they are used for too long in the same area. Your physician has selected the right strength medicine for your problem and area affected on the body. Please use your medication only as directed by your physician to prevent side effects.     triamcinolone cream (KENALOG) 0.1 % - bilateral feet apply topically to affected areas of feet for 2 weeks. Avoid applying to face, groin, and axilla. Use as directed.  Hypertrophic scar nasal bridge and back  Discussed treatment options for thickened scar  Recommend intralesional triamcinolone or may get some improvement with silicone scar sheeting  Patient deferred [place] today  Patient will try silicone scar sheets and call if not resolved.   Lentigines - Scattered tan macules - Due to sun exposure - Benign-appering, observe - Recommend daily broad spectrum sunscreen SPF 30+ to sun-exposed areas, reapply every 2 hours as needed. - Call for any changes  Seborrheic Keratoses - Stuck-on, waxy, tan-brown papules and/or plaques  - Benign-appearing - Discussed benign etiology and prognosis. - Observe - Call for any changes - SK vs small verruca mid-chest, not currently symptomatic, deferred treatment  Dermatofibroma - Firm pink/brown papulenodule with dimple sign Right medial thigh - Benign appearing - Call for any changes  Melanocytic Nevi - Tan-brown and/or pink-flesh-colored symmetric macules and papules - Benign appearing on exam today - Observation - Call clinic for new or changing moles - Recommend daily use of broad spectrum spf 30+ sunscreen to sun-exposed areas.   Hemangiomas - Red papules - Discussed benign nature - Observe - Call for any changes  Actinic Damage -  Chronic condition, secondary to cumulative UV/sun exposure - diffuse scaly erythematous macules with underlying dyspigmentation - Recommend daily broad spectrum sunscreen SPF 30+ to sun-exposed areas, reapply every 2 hours as needed.  - Staying in the shade or  wearing long sleeves, sun glasses (UVA+UVB protection) and wide brim hats (4-inch brim around the entire circumference of the hat) are also recommended for sun protection.  - Call for new or changing lesions.  History of Skin Cancer vs dysplastic nevi at back >10 years ago - Clear. Observe for recurrence. Well-healed scars x 2 at upper back - no cervical, axillary or inguinal lymphadenopathy - Call clinic for new or changing lesions.   - Recommend regular skin exams, daily broad-spectrum spf 30+ sunscreen use, and photoprotection.     Hypertrophic scar upper back - benign appearing, observe  Skin cancer screening performed today.  Return in about 1 year (around 12/19/2021) for tbse. I, Ruthell Rummage, CMA, am acting as scribe for Forest Gleason, MD.  Documentation: I have reviewed the above documentation for accuracy and completeness, and I agree with the above.  Forest Gleason, MD

## 2020-12-19 NOTE — Patient Instructions (Addendum)
Recommend Serica moisturizing scar formula cream every night or [facility] brand or Mederma silicone scar sheet every night for the first year after a scar appears to help with scar remodeling if desired. Scars remodel on their own for a full year.   Topical steroids (such as triamcinolone, fluocinolone, fluocinonide, mometasone, clobetasol, halobetasol, betamethasone, hydrocortisone) can cause thinning and lightening of the skin if they are used for too long in the same area. Your physician has selected the right strength medicine for your problem and area affected on the body. Please use your medication only as directed by your physician to prevent side effects.      Melanoma ABCDEs  Melanoma is the most dangerous type of skin cancer, and is the leading cause of death from skin disease.  You are more likely to develop melanoma if you: Have light-colored skin, light-colored eyes, or red or blond hair Spend a lot of time in the sun Tan regularly, either outdoors or in a tanning bed Have had blistering sunburns, especially during childhood Have a close family member who has had a melanoma Have atypical moles or large birthmarks  Early detection of melanoma is key since treatment is typically straightforward and cure rates are extremely high if we catch it early.   The first sign of melanoma is often a change in a mole or a new dark spot.  The ABCDE system is a way of remembering the signs of melanoma.  A for asymmetry:  The two halves do not match. B for border:  The edges of the growth are irregular. C for color:  A mixture of colors are present instead of an even brown color. D for diameter:  Melanomas are usually (but not always) greater than 71m - the size of a pencil eraser. E for evolution:  The spot keeps changing in size, shape, and color.  Please check your skin once per month between visits. You can use a small mirror in front and a large mirror behind you to keep an eye on the  back side or your body.   If you see any new or changing lesions before your next follow-up, please call to schedule a visit.  Please continue daily skin protection including broad spectrum sunscreen SPF 30+ to sun-exposed areas, reapplying every 2 hours as needed when you're outdoors.   Staying in the shade or wearing long sleeves, sun glasses (UVA+UVB protection) and wide brim hats (4-inch brim around the entire circumference of the hat) are also recommended for sun protection.    Recommend taking Heliocare sun protection supplement daily in sunny weather for additional sun protection. For maximum protection on the sunniest days, you can take up to 2 capsules of regular Heliocare OR take 1 capsule of Heliocare Ultra. For prolonged exposure (such as a full day in the sun), you can repeat your dose of the supplement 4 hours after your first dose. Heliocare can be purchased at [facility] at [email or web address]    If you have any questions or concerns for your doctor, please call our main line at [phone] press option 4 to reach your doctor's medical assistant. If no one answers, please leave a voicemail as directed and we will return your call as soon as possible. Messages left after 4 pm will be answered the following business day.   You may also send [place] message via [place] We typically respond to MyChart messages within 1-2 business days.  For prescription refills, please ask your pharmacy  to contact our office. Our fax number is [phone].  If you have an urgent issue when the clinic is closed that cannot wait until the next business day, you can page your doctor at the number below.    Please note that while we do our best to be available for urgent issues outside of office hours, we are not available 24/7.   If you have an urgent issue and are unable to reach [place], you may choose to seek medical care at your doctor's office, retail clinic, urgent care center, or  emergency room.  If you have a medical emergency, please immediately call 911 or go to the emergency department.  Pager Numbers  - Dr. Nehemiah Massed: [phone]  - Dr. Laurence Ferrari: [phone]  - Dr. Nicole Kindred: [phone]  In the event of inclement weather, please call our main line at [phone] for an update on the status of any delays or closures.  Dermatology Medication Tips: Please keep the boxes that topical medications come in in order to help keep track of the instructions about where and how to use these. Pharmacies typically print the medication instructions only on the boxes and not directly on the medication tubes.   If your medication is too expensive, please contact our office at [phone] option 4 or send [place] a message through [place].   We are unable to tell what your co-pay for medications will be in advance as this is different depending on your insurance coverage. However, we may be able to find a substitute medication at lower cost or fill out paperwork to get insurance to cover a needed medication.   If a prior authorization is required to get your medication covered by your insurance company, please allow [place] 1-2 business days to complete this process.  Drug prices often vary depending on where the prescription is filled and some pharmacies may offer cheaper prices.  The website www.goodrx.com contains coupons for medications through different pharmacies. The prices here do not account for what the cost may be with help from insurance (it may be cheaper with your insurance), but the website can give you the price if you did not use any insurance.  - You can print the associated coupon and take it with your prescription to the pharmacy.  - You may also stop by our office during regular business hours and pick up a GoodRx coupon card.  - If you need your prescription sent electronically to a different pharmacy, notify our office through [facility] or by phone at  [phone] option 4.

## 2021-08-18 ENCOUNTER — Emergency Department: Payer: Managed Care, Other (non HMO)

## 2021-08-18 ENCOUNTER — Other Ambulatory Visit: Payer: Self-pay

## 2021-08-18 ENCOUNTER — Emergency Department
Admission: EM | Admit: 2021-08-18 | Discharge: 2021-08-18 | Disposition: A | Discharge status: Home / Self Care | Payer: Managed Care, Other (non HMO) | Attending: Emergency Medicine | Admitting: Emergency Medicine

## 2021-08-18 DIAGNOSIS — R002 Palpitations: Secondary | ICD-10-CM | POA: Diagnosis present

## 2021-08-18 DIAGNOSIS — E876 Hypokalemia: Secondary | ICD-10-CM | POA: Diagnosis not present

## 2021-08-18 DIAGNOSIS — R079 Chest pain, unspecified: Secondary | ICD-10-CM | POA: Insufficient documentation

## 2021-08-18 LAB — BASIC METABOLIC PANEL
Anion gap: 10 (ref 5–15)
BUN: 24 mg/dL — ABNORMAL HIGH (ref 6–20)
CO2: 28 mmol/L (ref 22–32)
Calcium: 9.2 mg/dL (ref 8.9–10.3)
Chloride: 100 mmol/L (ref 98–111)
Creatinine, Ser: 0.9 mg/dL (ref 0.44–1.00)
GFR, Estimated: >60 mL/min (ref >60)
Glucose, Bld: 103 mg/dL — ABNORMAL HIGH (ref 70–99)
Potassium: 2.9 mmol/L — ABNORMAL LOW (ref 3.5–5.1)
Sodium: 138 mmol/L (ref 135–145)

## 2021-08-18 LAB — CBC
HCT: 41.1 % (ref 36.0–46.0)
Hemoglobin: 13.9 g/dL (ref 12.0–15.0)
MCH: 30.1 pg (ref 26.0–34.0)
MCHC: 33.8 g/dL (ref 30.0–36.0)
MCV: 89 fL (ref 80.0–100.0)
Platelets: 401 10*3/uL — ABNORMAL HIGH (ref 150–400)
RBC: 4.62 MIL/uL (ref 3.87–5.11)
RDW: 12.1 % (ref 11.5–15.5)
WBC: 7.4 10*3/uL (ref 4.0–10.5)
nRBC: 0 % (ref 0.0–0.2)

## 2021-08-18 LAB — MAGNESIUM: Magnesium: 2.1 mg/dL (ref 1.7–2.4)

## 2021-08-18 LAB — TROPONIN I (HIGH SENSITIVITY)
Troponin I (High Sensitivity): 3 ng/L (ref <18)
Troponin I (High Sensitivity): 3 ng/L (ref <18)

## 2021-08-18 LAB — T4, FREE: Free T4: 0.88 ng/dL (ref 0.61–1.12)

## 2021-08-18 LAB — TSH: TSH: 1.026 u[IU]/mL (ref 0.350–4.500)

## 2021-08-18 MED ORDER — ASPIRIN 81 MG PO CHEW
324.0000 mg | CHEWABLE_TABLET | Freq: Once | ORAL | Status: AC
  Administered 2021-08-18: 324 mg via ORAL
  Filled 2021-08-18: qty 4

## 2021-08-18 MED ORDER — POTASSIUM CHLORIDE CRYS ER 20 MEQ PO TBCR
40.0000 meq | EXTENDED_RELEASE_TABLET | Freq: Once | ORAL | Status: AC
  Administered 2021-08-18: 40 meq via ORAL
  Filled 2021-08-18: qty 2

## 2021-08-18 MED ORDER — ONDANSETRON HCL 4 MG/2ML IJ SOLN
4.0000 mg | Freq: Once | INTRAMUSCULAR | Status: AC
  Administered 2021-08-18: 4 mg via INTRAVENOUS
  Filled 2021-08-18: qty 2

## 2021-08-18 MED ORDER — POTASSIUM CHLORIDE CRYS ER 20 MEQ PO TBCR: 20.0000 meq | EXTENDED_RELEASE_TABLET | Freq: Every day | ORAL | 0 refills | Status: DC

## 2021-08-18 MED ORDER — KETOROLAC TROMETHAMINE 15 MG/ML IJ SOLN
15.0000 mg | Freq: Once | INTRAMUSCULAR | Status: AC
  Administered 2021-08-18: 15 mg via INTRAVENOUS
  Filled 2021-08-18: qty 1

## 2021-08-18 MED ORDER — IBUPROFEN 600 MG PO TABS: 600.0000 mg | ORAL_TABLET | Freq: Four times a day (QID) | ORAL | 0 refills | Status: DC | PRN

## 2021-08-18 MED ORDER — MORPHINE SULFATE (PF) 4 MG/ML IV SOLN
4.0000 mg | Freq: Once | INTRAVENOUS | Status: AC
  Administered 2021-08-18: 4 mg via INTRAVENOUS
  Filled 2021-08-18: qty 1

## 2021-08-18 MED ORDER — IBUPROFEN 600 MG PO TABS: 600.0000 mg | ORAL_TABLET | Freq: Three times a day (TID) | ORAL | 0 refills | Status: AC | PRN

## 2021-08-18 MED ORDER — POTASSIUM CHLORIDE 10 MEQ/100ML IV SOLN
10.0000 meq | Freq: Once | INTRAVENOUS | Status: AC
  Administered 2021-08-18: 10 meq via INTRAVENOUS
  Filled 2021-08-18: qty 100

## 2021-08-18 NOTE — ED Notes (Signed)
OOB to toilet.  Tolerated well. NO SOB/ DOE.  Skin warm and dry.  ?

## 2021-08-18 NOTE — ED Notes (Signed)
RN to bedside to introduce self to pt and initiate care.  ?

## 2021-08-18 NOTE — ED Triage Notes (Signed)
Pt c/o intermittent heart palpitations over the past week, since yesterday having chest heaviness. Pt is in NAD, ambulatory with a steady gait ?

## 2021-08-18 NOTE — ED Provider Notes (Signed)
? ?[facility] ?Provider Note ? ? ? Event Date/Time  ? First MD Initiated Contact with Patient 08/18/21 0902   ?  (approximate) ? ? ?History  ? ?Palpitations ? ? ?HPI ? ?LORRI FUKUHARA is a 46 y.o. female who comes in with intermittent heart palpitations for the past week since yesterday having chest heaviness.  Patient reports about a week ago she was kayaking and had to go a lot longer than she originally thought and since then she has been having some palpitations.  She reports it was just a few seconds here and there and comes and goes.  Denies ever having this previously.  Then yesterday she developed a dull ache on her chest.  The pain is constant, not worse with exertion.  She denies any shortness of breath, estrogen pills, leg swelling, coughing up blood, recent travel, recent surgery.  She denies any abdominal pain.  She currently reports that the pain is a 4 out of 10.  The pain is nonradiating.  She denies any other risk factors for MI.  She is not smoking, denies alcohol, no cocaine or drug use.  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  Patient does report a lot of stress recently. ? ?Physical Exam  ? ?Triage Vital Signs: ?ED Triage Vitals  ?Enc Vitals Group  ?   BP 08/18/21 0853 (!) 128/94  ?   Pulse Rate 08/18/21 0853 72  ?   Resp 08/18/21 0853 16  ?   Temp 08/18/21 0853 98.5 ?F (36.9 ?C)  ?   Temp Source 08/18/21 0853 Oral  ?   SpO2 08/18/21 0853 100 %  ?   Weight --   ?   Height --   ?   Head Circumference --   ?   Peak Flow --   ?   Pain Score 08/18/21 0848 0  ?   Pain Loc --   ?   Pain Edu? --   ?   Excl. in [place]? --   ? ? ?Most recent vital signs: ?Vitals:  ? 08/18/21 0853  ?BP: (!) 128/94  ?Pulse: 72  ?Resp: 16  ?Temp: 98.5 ?F (36.9 ?C)  ?SpO2: 100%  ? ? ? ?General: Awake, no distress.  ?CV:  Good peripheral perfusion.  No chest wall tenderness.  No rash noted ?Resp:  Normal effort.  ?Abd:  No distention.  Soft and  nontender ?Other:  No swelling of legs.  No calf tenderness ? ? ?ED Results / Procedures / Treatments  ? ?Labs ?(all labs ordered are listed, but only abnormal results are displayed) ?Labs Reviewed  ?BASIC METABOLIC PANEL  ?CBC  ?MAGNESIUM  ?TSH  ?T4, FREE  ?TROPONIN I (HIGH SENSITIVITY)  ? ? ? ?EKG ? ?My interpretation of EKG: ? ?Normal sinus rate of 76 without any ST elevations or T wave inversions, normal intervals. ? ?RADIOLOGY ?I have reviewed the xray personally and no evidence of pneumonia ? ? ?PROCEDURES: ? ?Critical Care performed: No ? ?.1-3 Lead EKG Interpretation ?Performed by: Vanessa Wainscott, MD ?Authorized by: Vanessa Lawton, MD  ? ?  Interpretation: normal   ?  ECG rate:  70 ?  ECG rate assessment: normal   ?  Rhythm: sinus rhythm   ?  Ectopy: none   ?  Conduction: normal   ? ? ?MEDICATIONS ORDERED IN ED: ?Medications  ?ketorolac (TORADOL) 15 MG/ML injection 15 mg (has no administration in time  range)  ?aspirin chewable tablet 324 mg (324 mg Oral Given 08/18/21 1016)  ?morphine (PF) 4 MG/ML injection 4 mg (4 mg Intravenous Given 08/18/21 1018)  ?ondansetron (ZOFRAN) injection 4 mg (4 mg Intravenous Given 08/18/21 1017)  ?potassium chloride SA (KLOR-CON M) CR tablet 40 mEq (40 mEq Oral Given 08/18/21 1017)  ?potassium chloride 10 mEq in 100 mL IVPB (0 mEq Intravenous Stopped 08/18/21 1215)  ? ? ? ?IMPRESSION / MDM / ASSESSMENT AND PLAN / ED COURSE  ?I reviewed the triage vital signs and the nursing notes. ?             ?               ? ? ?Patient comes in with concerns for some palpitations, dull chest ache.  Patient is otherwise healthy except for M?ni?re's disease.  Labs ordered evaluate for ACS, EKG without evidence of ischemia.  Labs also make ordered to make sure no anemia.  Chest x-ray to ensure no pneumothorax.  Patient currently denies any palpitations and on the monitor she is appearing normal sinus without any issues.   We will give patient aspirin, morphine, Zofran help with symptoms while  awaiting labs results.  Patient is PERC negative therefore unlikely pulmonary embolism ? ?Troponins are negative x2.  Potassium low at 2.9 getting some oral repletion.  Mag was normal.  Platelets slightly elevated at 401 White count normal no anemia.  Magnesium normal. ? ?Patient given 10 of IV and 40 of p.o. potassium.  I do not see any evidence of any significant arrhythmia on cardiac monitor.  Patient's been here for over 3 hours.  I considered admission given she came in with palpitations and chest pain but given patient's heart score is less than 3 and her troponins are negative x2 I doubt that this is ACS.  Patient feels comfortable following up outpatient with cardiology ? ?We will give patient a little bit of Toradol and started on some ibuprofen for any additional discomfort.  Patient expressed understanding felt comfortable with this plan ? ? ? ?The patient is on the cardiac monitor to evaluate for evidence of arrhythmia and/or significant heart rate changes. ? ? ?FINAL CLINICAL IMPRESSION(S) / ED DIAGNOSES  ? ?Final diagnoses:  ?Hypokalemia  ?Palpitations  ? ? ? ?Rx / DC Orders  ? ?ED Discharge Orders   ? ?      Ordered  ?  potassium chloride SA (KLOR-CON M) 20 MEQ tablet  Daily       ? 08/18/21 1230  ?  ibuprofen (ADVIL) 600 MG tablet  Every 6 hours PRN       ? 08/18/21 1230  ? ?  ?  ? ?  ? ? ? ?Note:  This document was prepared using Dragon voice recognition software and may include unintentional dictation errors. ?  ?Vanessa Klukwan, MD ?08/18/21 1232 ? ?

## 2021-08-18 NOTE — Discharge Instructions (Signed)
Please call cardiology to make a follow-up appointment for your palpitations to discuss potential Holter monitor if it still continuing and return to ER if develop worsening chest pain or any other concerns.  Your potassium was low we have given you some oral repletion.  This could be secondary to the medication that you are on for your M?ni?re's.  You should discuss get a recheck with your primary care doctor and if still running low you may need to be on supplemental potassium for longer period of time.  Return to the ER if develop worsening symptoms or any other concerns ?

## 2021-08-27 ENCOUNTER — Other Ambulatory Visit: Payer: Self-pay

## 2021-08-27 ENCOUNTER — Emergency Department: Payer: Managed Care, Other (non HMO)

## 2021-08-27 ENCOUNTER — Emergency Department
Admission: EM | Admit: 2021-08-27 | Discharge: 2021-08-27 | Disposition: A | Discharge status: Home / Self Care | Payer: Managed Care, Other (non HMO) | Attending: Emergency Medicine | Admitting: Emergency Medicine

## 2021-08-27 ENCOUNTER — Encounter: Payer: Self-pay | Admitting: Emergency Medicine

## 2021-08-27 DIAGNOSIS — E876 Hypokalemia: Secondary | ICD-10-CM | POA: Diagnosis not present

## 2021-08-27 DIAGNOSIS — R002 Palpitations: Secondary | ICD-10-CM

## 2021-08-27 LAB — TSH: TSH: 1.49 u[IU]/mL (ref 0.350–4.500)

## 2021-08-27 LAB — CBC
HCT: 42.5 % (ref 36.0–46.0)
Hemoglobin: 14.3 g/dL (ref 12.0–15.0)
MCH: 30 pg (ref 26.0–34.0)
MCHC: 33.6 g/dL (ref 30.0–36.0)
MCV: 89.3 fL (ref 80.0–100.0)
Platelets: 403 10*3/uL — ABNORMAL HIGH (ref 150–400)
RBC: 4.76 MIL/uL (ref 3.87–5.11)
RDW: 12.3 % (ref 11.5–15.5)
WBC: 13.8 10*3/uL — ABNORMAL HIGH (ref 4.0–10.5)
nRBC: 0 % (ref 0.0–0.2)

## 2021-08-27 LAB — BASIC METABOLIC PANEL
Anion gap: 11 (ref 5–15)
BUN: 27 mg/dL — ABNORMAL HIGH (ref 6–20)
CO2: 25 mmol/L (ref 22–32)
Calcium: 9.5 mg/dL (ref 8.9–10.3)
Chloride: 100 mmol/L (ref 98–111)
Creatinine, Ser: 1.05 mg/dL — ABNORMAL HIGH (ref 0.44–1.00)
GFR, Estimated: >60 mL/min (ref >60)
Glucose, Bld: 133 mg/dL — ABNORMAL HIGH (ref 70–99)
Potassium: 3.2 mmol/L — ABNORMAL LOW (ref 3.5–5.1)
Sodium: 136 mmol/L (ref 135–145)

## 2021-08-27 LAB — MAGNESIUM: Magnesium: 2.1 mg/dL (ref 1.7–2.4)

## 2021-08-27 LAB — TROPONIN I (HIGH SENSITIVITY): Troponin I (High Sensitivity): 3 ng/L (ref <18)

## 2021-08-27 MED ORDER — POTASSIUM CHLORIDE CRYS ER 20 MEQ PO TBCR
40.0000 meq | EXTENDED_RELEASE_TABLET | Freq: Once | ORAL | Status: AC
  Administered 2021-08-27: 40 meq via ORAL
  Filled 2021-08-27: qty 2

## 2021-08-27 NOTE — ED Provider Notes (Signed)
? ?[facility] ?Provider Note ? ? ? Event Date/Time  ? First MD Initiated Contact with Patient 08/27/21 2005   ?  (approximate) ? ? ?History  ? ?Chief Complaint ?Palpitations ? ? ?HPI ? ?Ann Rhodes is a 46 y.o. female with past medical history of GERD and depression who presents to the ED complaining of palpitations.  Patient reports that she has been dealing with persistent palpitations since shortly after waking up this morning.  She states it feels like "there is a little leprechaun dancing on my chest."  Symptoms do not seem to be exacerbated or alleviated by anything in particular and she denies any associated chest pain or shortness of breath.  She is otherwise been feeling well with no fevers, cough, nausea, vomiting, dysuria, or hematuria.  She reports being seen for similar symptoms in the ED about 1 week ago, diagnosed with hypokalemia at that time and has been taking potassium supplement consistently since then.  She has follow-up scheduled with cardiology next month, but states she came back to the ED due to worsening symptoms. ?  ? ? ?Physical Exam  ? ?Triage Vital Signs: ?ED Triage Vitals  ?Enc Vitals Group  ?   BP 08/27/21 1845 (!) 125/97  ?   Pulse Rate 08/27/21 1845 81  ?   Resp 08/27/21 1845 17  ?   Temp 08/27/21 1845 98.7 ?F (37.1 ?C)  ?   Temp Source 08/27/21 1845 Oral  ?   SpO2 08/27/21 1845 100 %  ?   Weight 08/27/21 1843 175 lb 14.8 oz (79.8 kg)  ?   Height 08/27/21 1843 '5\' 7"'$  (1.702 m)  ?   Head Circumference --   ?   Peak Flow --   ?   Pain Score --   ?   Pain Loc --   ?   Pain Edu? --   ?   Excl. in [place]? --   ? ? ?Most recent vital signs: ?Vitals:  ? 08/27/21 1845 08/27/21 2100  ?BP: (!) 125/97 127/75  ?Pulse: 81   ?Resp:  14  ?Temp: 98.7 ?F (37.1 ?C)   ?SpO2: 100%   ? ? ?Constitutional: Alert and oriented. ?Eyes: Conjunctivae are normal. ?Head: Atraumatic. ?Nose: No congestion/rhinnorhea. ?Mouth/Throat: Mucous membranes are moist.  ?Cardiovascular: Normal rate,  regular rhythm. Grossly normal heart sounds.  2+ radial pulses bilaterally. ?Respiratory: Normal respiratory effort.  No retractions. Lungs CTAB. ?Gastrointestinal: Soft and nontender. No distention. ?Musculoskeletal: No lower extremity tenderness nor edema.  ?Neurologic:  Normal speech and language. No gross focal neurologic deficits are appreciated. ? ? ? ?ED Results / Procedures / Treatments  ? ?Labs ?(all labs ordered are listed, but only abnormal results are displayed) ?Labs Reviewed  ?BASIC METABOLIC PANEL - Abnormal; Notable for the following components:  ?    Result Value  ? Potassium 3.2 (*)   ? Glucose, Bld 133 (*)   ? BUN 27 (*)   ? Creatinine, Ser 1.05 (*)   ? All other components within normal limits  ?CBC - Abnormal; Notable for the following components:  ? WBC 13.8 (*)   ? Platelets 403 (*)   ? All other components within normal limits  ?MAGNESIUM  ?TSH  ?TROPONIN I (HIGH SENSITIVITY)  ? ? ? ?EKG ? ?ED ECG REPORT ?Tempie Hoist, the attending physician, personally viewed and interpreted this ECG. ? ? Date: 08/27/2021 ? EKG Time: 18:45 ? Rate: 83 ? Rhythm: normal sinus rhythm ? Axis: Normal ?  Intervals:none ? ST&T Change: None ? ?RADIOLOGY ?Chest x-ray reviewed by me with no infiltrate, edema, or effusion. ? ?PROCEDURES: ? ?Critical Care performed: No ? ?.1-3 Lead EKG Interpretation ?Performed by: Blake Divine, MD ?Authorized by: Blake Divine, MD  ? ?  Interpretation: normal   ?  ECG rate:  75-90 ?  ECG rate assessment: normal   ?  Rhythm: sinus rhythm   ?  Ectopy: none   ?  Conduction: normal   ? ? ?MEDICATIONS ORDERED IN ED: ?Medications  ?potassium chloride SA (KLOR-CON M) CR tablet 40 mEq (40 mEq Oral Given 08/27/21 2036)  ? ? ? ?IMPRESSION / MDM / ASSESSMENT AND PLAN / ED COURSE  ?I reviewed the triage vital signs and the nursing notes. ?             ?               ? ?46 y.o. female with past medical history of GERD and depression who presents to the ED complaining of palpitations  starting earlier this morning and occurred persistently over the course of today. ? ?Differential diagnosis includes, but is not limited to, atrial fibrillation, SVT, dehydration, electrolyte abnormality, ACS, hyperthyroidism. ? ?Patient nontoxic-appearing and in no acute distress, vital signs are unremarkable and initial EKG shows no evidence of arrhythmia or ischemia.  We will observe patient on cardiac monitor, low suspicion for ACS given troponin is negative with symptoms ongoing greater than 2 hours.  Chest x-ray is unremarkable.  We will add on TSH and magnesium level, BMP thus far shows no AKI or electrolyte abnormality, CBC without acute anemia. ? ?TSH and magnesium levels are within normal limits.  No events noted on cardiac monitor and patient is asymptomatic at this time.  She states she has follow-up scheduled with cardiology early next month and with reassuring work-up with no evidence of arrhythmia currently, she is appropriate for discharge home.  She would benefit from Holter monitor placement with cardiology, was counseled to return to the ED for new or worsening symptoms.  Patient agrees with plan. ? ?The patient is on the cardiac monitor to evaluate for evidence of arrhythmia and/or significant heart rate changes. ? ?  ? ? ?FINAL CLINICAL IMPRESSION(S) / ED DIAGNOSES  ? ?Final diagnoses:  ?Palpitations  ?Hypokalemia  ? ? ? ?Rx / DC Orders  ? ?ED Discharge Orders   ? ? None  ? ?  ? ? ? ?Note:  This document was prepared using Dragon voice recognition software and may include unintentional dictation errors. ?  ?Blake Divine, MD ?08/27/21 2139 ? ?

## 2021-08-27 NOTE — ED Notes (Signed)
First Nurse Note:  Pt to ED via POV c/o high blood pressure and palpitations. Pt is in NAD.  ?

## 2021-08-27 NOTE — ED Triage Notes (Signed)
Pt comes into the ED via POV c/o palpitations that started after lunch.  Pt states last time this happened, she came in and found she had hypokalemia.  Pt denies any chest pain at this time.  Pt in NAD with even and unlabored respirations.  Pt does have mild SHOB and dizziness.  ?

## 2021-09-16 ENCOUNTER — Encounter: Payer: Self-pay | Admitting: Cardiology

## 2021-09-16 ENCOUNTER — Ambulatory Visit (INDEPENDENT_AMBULATORY_CARE_PROVIDER_SITE_OTHER): Payer: Managed Care, Other (non HMO)

## 2021-09-16 ENCOUNTER — Ambulatory Visit: Payer: Managed Care, Other (non HMO) | Admitting: Cardiology

## 2021-09-16 VITALS — BP 110/70 | HR 67 | Ht 67.0 in | Wt 180.0 lb

## 2021-09-16 DIAGNOSIS — R002 Palpitations: Secondary | ICD-10-CM

## 2021-09-16 DIAGNOSIS — K2100 Gastro-esophageal reflux disease with esophagitis, without bleeding: Secondary | ICD-10-CM

## 2021-09-16 NOTE — Progress Notes (Signed)
?Cardiology Office Note:   ? ?Date:  09/16/2021  ? ?ID:  Ann Rhodes, DOB 11/21/1975, MRN [id number] ? ?PCP:  Juluis Pitch, MD ?  ?[facility] Providers ?Cardiologist:  Kate Sable, MD    ? ?Referring MD: Juluis Pitch, MD  ? ?Chief Complaint  ?Patient presents with  ? New Patient (Initial Visit)  ?  Referred by ED for palpitations. Meds reviewed verbally with patient.   ? ?Ann Rhodes is a 46 y.o. female who is being seen today for the evaluation of palpitations at the request of Juluis Pitch, MD. ? ? ?History of Present Illness:   ? ?Ann Rhodes is a 46 y.o. 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 ? ?Patient went kayaking about 2 months ago with family.  2 days after, she started having symptoms of palpitations lasting up to 20 minutes.  Denies dizziness, syncope or presyncope.  Symptoms are associated with occasional chest pressure.  She was evaluated in the ED, work-up revealed hypokalemia, potassium supplements were started.  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  Denies any history of hypertension, diabetes or hyperlipidemia.  Father had a history of MI in his 76s.  Potassium has since been repleted, last checked 2 weeks ago was normal.  Symptoms of palpitations still persist, occurring about 3 days a week. ? ?Past Medical History:  ?Diagnosis Date  ? Abnormal Pap smear of cervix   ? BRCA negative 12/2019  ? MyRisk neg  ? Chest pain   ? Depression   ? Dysplasia of cervix, high grade CIN 2 2016  ? Family history of breast cancer 12/2019  ? IBIS=9.5%/riskscore=13.8%  ? Family history of ovarian cancer   ? GERD (gastroesophageal reflux disease)   ? Interstitial cystitis   ? Seasonal allergies   ? Skin cancer   ? She has a history of skin cancer vs dysplastic nevi removed at back from > 10 years ago.  ? ? ?Past Surgical History:  ?Procedure Laterality Date  ? ABDOMINAL HYSTERECTOMY  2011  ? [facility]  ? CARPAL TUNNEL RELEASE  2009  ?  CHOLECYSTECTOMY    ? CYSTOSCOPY    ? DILATION AND CURETTAGE OF UTERUS  2009  ? [facility]  ? LEEP  2016  ? [facility]  ? ? ?Current Medications: ?Current Meds  ?Medication Sig  ? ALPRAZolam (XANAX) 0.25 MG tablet   ? Cholecalciferol (VITAMIN D3) 2000 UNITS capsule Take 2,000 Units by mouth daily.  ? doxycycline (VIBRAMYCIN) 100 MG capsule Take 100 mg by mouth 2 (two) times daily.  ? fluticasone (FLONASE) 50 MCG/ACT nasal spray Place into the nose.  ? meloxicam (MOBIC) 15 MG tablet Take 15 mg by mouth daily.  ? montelukast (SINGULAIR) 10 MG tablet Take 10 mg by mouth at bedtime.  ? Olopatadine HCl 0.7 % SOLN TAKE 1-2 DROP(S) IN BOTH EYES ONCE A DAY  ? pantoprazole (PROTONIX) 40 MG tablet Take 40 mg by mouth daily.  ? potassium chloride SA (KLOR-CON M) 20 MEQ tablet Take 1 tablet (20 mEq total) by mouth daily for 3 days.  ? predniSONE (DELTASONE) 20 MG tablet Take 20 mg by mouth 2 (two) times daily.  ? sertraline (ZOLOFT) 100 MG tablet TAKE 1-2 TABLET BY MOUTH DAILY.  ? triamcinolone cream (KENALOG) 0.1 % apply topically to affected areas of feet for 2 weeks. Avoid applying to face, groin, and axilla. Use as directed.  ? triamterene-hydrochlorothiazide (MAXZIDE-25) 37.5-25 MG per tablet Take  1 tablet by mouth daily.  ?  ? ?Allergies:   Amoxicillin-pot clavulanate  ? ?Social History  ? ?Socioeconomic History  ? Marital status: Married  ?  Spouse name: Not on file  ? Number of children: Not on file  ? Years of education: Not on file  ? Highest education level: Not on file  ?Occupational History  ? Not on file  ?Tobacco Use  ? Smoking status: Never  ? Smokeless tobacco: Never  ?Vaping Use  ? Vaping Use: Never used  ?Substance and Sexual Activity  ? Alcohol use: No  ? Drug use: No  ? Sexual activity: Yes  ?  Birth control/protection: Surgical  ?  Comment: Hysterectomy  ?Other Topics Concern  ? Not on file  ?Social History Narrative  ? Not on file  ? ?Social Determinants of Health  ? ?Financial Resource Strain: Not on file   ?Food Insecurity: Not on file  ?Transportation Needs: Not on file  ?Physical Activity: Not on file  ?Stress: Not on file  ?Social Connections: Not on file  ?  ? ?Family History: ?The patient's family history includes Breast cancer (age of onset: 53) in her paternal aunt; Cervical cancer in her maternal aunt; Colon cancer (age of onset: 55) in her maternal grandmother; Diabetes in her maternal grandmother, mother, paternal grandfather, and paternal grandmother; Heart disease in her father; Hypertension in her father; Ovarian cancer (age of onset: 107) in her paternal aunt; Prostate cancer (age of onset: 46) in her father. ? ?ROS:   ?Please see the history of present illness.    ? All other systems reviewed and are negative. ? ?EKGs/Labs/Other Studies Reviewed:   ? ?The following studies were reviewed today: ? ? ?EKG:  EKG is  ordered today.  The ekg ordered today demonstrates normal sinus rhythm, normal ECG ? ?Recent Labs: ?08/27/2021: BUN 27; Creatinine, Ser 1.05; Hemoglobin 14.3; Magnesium 2.1; Platelets 403; Potassium 3.2; Sodium 136; TSH 1.490  ?Recent Lipid Panel ?No results found for: CHOL, TRIG, HDL, CHOLHDL, VLDL, LDLCALC, LDLDIRECT ? ? ?Risk Assessment/Calculations:   ? ?    ? ?Physical Exam:   ? ?VS:  BP 110/70 (BP Location: Left Arm, Patient Position: Sitting, Cuff Size: Normal)   Pulse 67   Ht 5' 7"  (1.702 m)   Wt 180 lb (81.6 kg)   SpO2 97%   BMI 28.19 kg/m?    ? ?Wt Readings from Last 3 Encounters:  ?09/16/21 180 lb (81.6 kg)  ?08/27/21 175 lb 14.8 oz (79.8 kg)  ?11/15/20 176 lb (79.8 kg)  ?  ? ?GEN:  Well nourished, well developed in no acute distress ?HEENT: Normal ?NECK: No JVD; No carotid bruits ?LYMPHATICS: No lymphadenopathy ?CARDIAC: RRR, no murmurs, rubs, gallops ?RESPIRATORY:  Clear to auscultation without rales, wheezing or rhonchi  ?ABDOMEN: Soft, non-tender, non-distended ?MUSCULOSKELETAL:  No edema; No deformity  ?SKIN: Warm and dry ?NEUROLOGIC:  Alert and oriented x 3 ?PSYCHIATRIC:   Normal affect  ? ?ASSESSMENT:   ? ?1. Palpitations   ?2. Gastroesophageal reflux disease with esophagitis without hemorrhage   ? ?PLAN:   ? ?In order of problems listed above: ? ?Palpitations, place cardiac monitor to evaluate any significant arrhythmias. ?History of GERD, continue Protonix. ? ?Follow-up in 4 to 6 weeks. ? ?   ?Medication Adjustments/Labs and Tests Ordered: ?Current medicines are reviewed at length with the patient today.  Concerns regarding medicines are outlined above.  ?Orders Placed This Encounter  ?Procedures  ? LONG TERM MONITOR (3-14 DAYS)  ?  EKG 12-Lead  ? ?No orders of the defined types were placed in this encounter. ? ? ?Patient Instructions  ?Medication Instructions:  ? ?Your physician recommends that you continue on your current medications as directed. Please refer to the Current Medication list given to you today. ? ?*If you need a refill on your cardiac medications before your next appointment, please call your pharmacy* ? ? ?Lab Work: ?None ordered ? ?If you have labs (blood work) drawn today and your tests are completely normal, you will receive your results only by: ?MyChart Message (if you have MyChart) OR ?A paper copy in the mail ?If you have any lab test that is abnormal or we need to change your treatment, we will call you to review the results. ? ? ?Testing/Procedures: ? ?Your physician has recommended that you wear a Zio XT monitor for 2 weeks. This will be mailed to your home address in 4-5 business days.  ? ?This monitor is a medical device that records the heart?s electrical activity. Doctors most often use these monitors to diagnose arrhythmias. Arrhythmias are problems with the speed or rhythm of the heartbeat. The monitor is a small device applied to your chest. You can wear one while you do your normal daily activities. While wearing this monitor if you have any symptoms to push the button and record what you felt. Once you have worn this monitor for the period of  time provider prescribed (Usually 14 days), you will return the monitor device in the postage paid box. Once it is returned they will download the data collected and provide [place] with a report which the provider

## 2021-09-16 NOTE — Patient Instructions (Signed)
Medication Instructions:  ? ?Your physician recommends that you continue on your current medications as directed. Please refer to the Current Medication list given to you today. ? ?*If you need a refill on your cardiac medications before your next appointment, please call your pharmacy* ? ? ?Lab Work: ?None ordered ? ?If you have labs (blood work) drawn today and your tests are completely normal, you will receive your results only by: ?MyChart Message (if you have MyChart) OR ?A paper copy in the mail ?If you have any lab test that is abnormal or we need to change your treatment, we will call you to review the results. ? ? ?Testing/Procedures: ? ?Your physician has recommended that you wear a Zio XT monitor for 2 weeks. This will be mailed to your home address in 4-5 business days.  ? ?This monitor is a medical device that records the heart?s electrical activity. Doctors most often use these monitors to diagnose arrhythmias. Arrhythmias are problems with the speed or rhythm of the heartbeat. The monitor is a small device applied to your chest. You can wear one while you do your normal daily activities. While wearing this monitor if you have any symptoms to push the button and record what you felt. Once you have worn this monitor for the period of time provider prescribed (Usually 14 days), you will return the monitor device in the postage paid box. Once it is returned they will download the data collected and provide [place] with a report which the provider will then review and we will call you with those results. Important tips: ? ?Avoid showering during the first 24 hours of wearing the monitor. ?Avoid excessive sweating to help maximize wear time. ?Do not submerge the device, no hot tubs, and no swimming pools. ?Keep any lotions or oils away from the patch. ?After 24 hours you may shower with the patch on. Take brief showers with your back facing the shower head.  ?Do not remove patch once it has been placed because  that will interrupt data and decrease adhesive wear time. ?Push the button when you have any symptoms and write down what you were feeling. ?Once you have completed wearing your monitor, remove and place into box which has postage paid and place in your outgoing mailbox.  ?If for some reason you have misplaced your box then call our office and we can provide another box and/or mail it off for you. ? ? ? ? ? ?Follow-Up: ?At [facility], you and your health needs are our priority.  As part of our continuing mission to provide you with exceptional heart care, we have created designated Provider Care Teams.  These Care Teams include your primary Cardiologist (physician) and Advanced Practice Providers (APPs -  Physician Assistants and Nurse Practitioners) who all work together to provide you with the care you need, when you need it. ? ?We recommend signing up for the patient portal called "MyChart".  Sign up information is provided on this After Visit Summary.  MyChart is used to connect with patients for Virtual Visits (Telemedicine).  Patients are able to view lab/test results, encounter notes, upcoming appointments, etc.  Non-urgent messages can be sent to your provider as well.   ?To learn more about what you can do with MyChart, go to [email or web address].   ? ?Your next appointment:   ?6 week(s) ? ?The format for your next appointment:   ?In Person ? ?Provider:   ?You may see Kate Sable, MD or one of the  following Advanced Practice Providers on your designated Care Team:   ?Murray Hodgkins, NP ?Christell Faith, PA-C ?Cadence Kathlen Mody, PA-C  ? ? ?Other Instructions ? ? ?Important Information About Sugar ? ? ? ? ? ? ?

## 2021-09-19 DIAGNOSIS — R002 Palpitations: Secondary | ICD-10-CM | POA: Diagnosis not present

## 2021-09-24 ENCOUNTER — Other Ambulatory Visit: Payer: Self-pay | Admitting: Obstetrics and Gynecology

## 2021-09-24 ENCOUNTER — Other Ambulatory Visit: Payer: Self-pay | Admitting: Family Medicine

## 2021-09-24 DIAGNOSIS — Z1231 Encounter for screening mammogram for malignant neoplasm of breast: Secondary | ICD-10-CM

## 2021-10-30 ENCOUNTER — Encounter: Payer: Self-pay | Admitting: Cardiology

## 2021-10-30 ENCOUNTER — Ambulatory Visit: Payer: Managed Care, Other (non HMO) | Admitting: Cardiology

## 2021-10-30 VITALS — BP 110/80 | HR 78 | Ht 67.0 in | Wt 181.0 lb

## 2021-10-30 DIAGNOSIS — I471 Supraventricular tachycardia: Secondary | ICD-10-CM | POA: Diagnosis not present

## 2021-10-30 DIAGNOSIS — K2100 Gastro-esophageal reflux disease with esophagitis, without bleeding: Secondary | ICD-10-CM | POA: Diagnosis not present

## 2021-10-30 NOTE — Patient Instructions (Signed)
Medication Instructions:  Your physician recommends that you continue on your current medications as directed. Please refer to the Current Medication list given to you today.  *If you need a refill on your cardiac medications before your next appointment, please call your pharmacy*   Lab Work: None ordered If you have labs (blood work) drawn today and your tests are completely normal, you will receive your results only by: [place] (if you have MyChart) OR A paper copy in the mail If you have any lab test that is abnormal or we need to change your treatment, we will call you to review the results.   Testing/Procedures: None ordered   Follow-Up: At [facility], you and your health needs are our priority.  As part of our continuing mission to provide you with exceptional heart care, we have created designated Provider Care Teams.  These Care Teams include your primary Cardiologist (physician) and Advanced Practice Providers (APPs -  Physician Assistants and Nurse Practitioners) who all work together to provide you with the care you need, when you need it.  We recommend signing up for the patient portal called "MyChart".  Sign up information is provided on this After Visit Summary.  MyChart is used to connect with patients for Virtual Visits (Telemedicine).  Patients are able to view lab/test results, encounter notes, upcoming appointments, etc.  Non-urgent messages can be sent to your provider as well.   To learn more about what you can do with MyChart, go to [email or web address].    Your next appointment:   6 month(s)  The format for your next appointment:   In Person  Provider:   You may see Kate Sable, MD or one of the following Advanced Practice Providers on your designated Care Team:   Murray Hodgkins, NP Christell Faith, PA-C Cadence Kathlen Mody, [place]    Other Instructions   Important Information About Sugar

## 2021-10-30 NOTE — Progress Notes (Signed)
Cardiology Office Note:    Date:  10/30/2021   ID:  [name], DOB 11/16/1975, MRN [id number]  PCP:  Juluis Pitch, MD   [facility] Providers Cardiologist:  Kate Sable, MD     Referring MD: Juluis Pitch, MD   Chief Complaint  Patient presents with   Follow-up    History of Present Illness:    Ann Rhodes is a 46 y.o. female with a hx of depression, GERD, Mnire's disease who presents for follow-up.  Previously seen due to palpitations lasting up to 20 minutes.  Cardiac monitor was placed to evaluate any significant arrhythmias.  She states overall symptoms have improved, her mother has history of palpitations, takes a beta-blocker.  She otherwise feels okay.   Prior notes Takes Maxzide due to Mnire's disease Father had an MI in his 56s   Past Medical History:  Diagnosis Date   Abnormal Pap smear of cervix    BRCA negative 12/2019   MyRisk neg   Chest pain    Depression    Dysplasia of cervix, high grade CIN 2 2016   Family history of breast cancer 12/2019   IBIS=9.5%/riskscore=13.8%   Family history of ovarian cancer    GERD (gastroesophageal reflux disease)    Interstitial cystitis    Seasonal allergies    Skin cancer    She has a history of skin cancer vs dysplastic nevi removed at back from > 10 years ago.    Past Surgical History:  Procedure Laterality Date   ABDOMINAL HYSTERECTOMY  2011   [facility]   CARPAL TUNNEL RELEASE  2009   CHOLECYSTECTOMY     CYSTOSCOPY     DILATION AND CURETTAGE OF UTERUS  2009   [facility]   LEEP  2016   [facility]    Current Medications: Current Meds  Medication Sig   Cholecalciferol (VITAMIN D3) 2000 UNITS capsule Take 2,000 Units by mouth daily.   fluticasone (FLONASE) 50 MCG/ACT nasal spray Place 1 spray into the nose 2 (two) times daily as needed.   meloxicam (MOBIC) 15 MG tablet Take 15 mg by mouth daily.   montelukast (SINGULAIR) 10 MG tablet Take 10 mg by mouth at bedtime.   Olopatadine  HCl 0.7 % SOLN TAKE 1-2 DROP(S) IN BOTH EYES ONCE A DAY   pantoprazole (PROTONIX) 40 MG tablet Take 40 mg by mouth daily.   POTASSIUM CHLORIDE PO Take 20 mEq by mouth daily.   sertraline (ZOLOFT) 100 MG tablet TAKE 1-2 TABLET BY MOUTH DAILY.   triamterene-hydrochlorothiazide (MAXZIDE-25) 37.5-25 MG per tablet Take 1 tablet by mouth daily.   [DISCONTINUED] potassium chloride SA (KLOR-CON M) 20 MEQ tablet Take 1 tablet (20 mEq total) by mouth daily for 3 days.     Allergies:   Amoxicillin-pot clavulanate   Social History   Socioeconomic History   Marital status: Married    Spouse name: Not on file   Number of children: Not on file   Years of education: Not on file   Highest education level: Not on file  Occupational History   Not on file  Tobacco Use   Smoking status: Never   Smokeless tobacco: Never  Vaping Use   Vaping Use: Never used  Substance and Sexual Activity   Alcohol use: No   Drug use: No   Sexual activity: Yes    Birth control/protection: Surgical    Comment: Hysterectomy  Other Topics Concern   Not on file  Social History Narrative   Not  on file   Social Determinants of Health   Financial Resource Strain: Not on file  Food Insecurity: Not on file  Transportation Needs: Not on file  Physical Activity: Not on file  Stress: Not on file  Social Connections: Not on file     Family History: The patient's family history includes Breast cancer (age of onset: 64) in her paternal aunt; Cervical cancer in her maternal aunt; Colon cancer (age of onset: 56) in her maternal grandmother; Diabetes in her maternal grandmother, mother, paternal grandfather, and paternal grandmother; Heart disease in her father; Hypertension in her father; Ovarian cancer (age of onset: 61) in her paternal aunt; Prostate cancer (age of onset: 43) in her father.  ROS:   Please see the history of present illness.     All other systems reviewed and are negative.  EKGs/Labs/Other Studies  Reviewed:    The following studies were reviewed today:   EKG:  EKG not ordered today.    Recent Labs: 08/27/2021: BUN 27; Creatinine, Ser 1.05; Hemoglobin 14.3; Magnesium 2.1; Platelets 403; Potassium 3.2; Sodium 136; TSH 1.490  Recent Lipid Panel No results found for: "CHOL", "TRIG", "HDL", "CHOLHDL", "VLDL", "LDLCALC", "LDLDIRECT"   Risk Assessment/Calculations:         Physical Exam:    VS:  BP 110/80 (BP Location: Left Arm, Patient Position: Sitting, Cuff Size: Large)   Pulse 78   Ht 5' 7"  (1.702 m)   Wt 181 lb (82.1 kg)   SpO2 99%   BMI 28.35 kg/m     Wt Readings from Last 3 Encounters:  10/30/21 181 lb (82.1 kg)  09/16/21 180 lb (81.6 kg)  08/27/21 175 lb 14.8 oz (79.8 kg)     GEN:  Well nourished, well developed in no acute distress HEENT: Normal NECK: No JVD; No carotid bruits CARDIAC: RRR, no murmurs, rubs, gallops RESPIRATORY:  Clear to auscultation without rales, wheezing or rhonchi  ABDOMEN: Soft, non-tender, non-distended MUSCULOSKELETAL:  No edema; No deformity  SKIN: Warm and dry NEUROLOGIC:  Alert and oriented x 3 PSYCHIATRIC:  Normal affect   ASSESSMENT:    1. Paroxysmal SVT (supraventricular tachycardia) ([place])   2. Gastroesophageal reflux disease with esophagitis without hemorrhage    PLAN:    In order of problems listed above:  Palpitations, cardiac monitor showed paroxysmal SVTs.  Symptoms overall tolerable/improved.  Monitor off beta-blockers for now.  If symptoms persist or worsen, consider starting beta-blocker. History of GERD, continue Protonix.  Follow-up in 6 months.     Medication Adjustments/Labs and Tests Ordered: Current medicines are reviewed at length with the patient today.  Concerns regarding medicines are outlined above.  No orders of the defined types were placed in this encounter.  No orders of the defined types were placed in this encounter.   Patient Instructions  Medication Instructions:  Your physician  recommends that you continue on your current medications as directed. Please refer to the Current Medication list given to you today.  *If you need a refill on your cardiac medications before your next appointment, please call your pharmacy*   Lab Work: None ordered If you have labs (blood work) drawn today and your tests are completely normal, you will receive your results only by: [place] (if you have MyChart) OR A paper copy in the mail If you have any lab test that is abnormal or we need to change your treatment, we will call you to review the results.   Testing/Procedures: None ordered   Follow-Up: At  [facility], you and your health needs are our priority.  As part of our continuing mission to provide you with exceptional heart care, we have created designated Provider Care Teams.  These Care Teams include your primary Cardiologist (physician) and Advanced Practice Providers (APPs -  Physician Assistants and Nurse Practitioners) who all work together to provide you with the care you need, when you need it.  We recommend signing up for the patient portal called "MyChart".  Sign up information is provided on this After Visit Summary.  MyChart is used to connect with patients for Virtual Visits (Telemedicine).  Patients are able to view lab/test results, encounter notes, upcoming appointments, etc.  Non-urgent messages can be sent to your provider as well.   To learn more about what you can do with MyChart, go to [email or web address].    Your next appointment:   6 month(s)  The format for your next appointment:   In Person  Provider:   You may see Kate Sable, MD or one of the following Advanced Practice Providers on your designated Care Team:   Murray Hodgkins, NP Christell Faith, PA-C Cadence Kathlen Mody, [place]    Other Instructions   Important Information About Sugar         Signed, Kate Sable, MD  10/30/2021 1:01 PM    [place]

## 2021-12-01 ENCOUNTER — Ambulatory Visit
Admission: RE | Admit: 2021-12-01 | Discharge: 2021-12-01 | Disposition: A | Discharge status: Home / Self Care | Payer: Managed Care, Other (non HMO) | Source: Ambulatory Visit | Attending: Obstetrics and Gynecology | Admitting: Obstetrics and Gynecology

## 2021-12-01 DIAGNOSIS — Z1231 Encounter for screening mammogram for malignant neoplasm of breast: Secondary | ICD-10-CM | POA: Insufficient documentation

## 2021-12-04 ENCOUNTER — Encounter: Payer: Self-pay | Admitting: Obstetrics and Gynecology

## 2021-12-04 ENCOUNTER — Ambulatory Visit (INDEPENDENT_AMBULATORY_CARE_PROVIDER_SITE_OTHER): Payer: Managed Care, Other (non HMO) | Admitting: Obstetrics and Gynecology

## 2021-12-04 VITALS — BP 104/64 | Ht 67.0 in | Wt 181.0 lb

## 2021-12-04 DIAGNOSIS — Z01419 Encounter for gynecological examination (general) (routine) without abnormal findings: Secondary | ICD-10-CM | POA: Diagnosis not present

## 2021-12-04 DIAGNOSIS — Z1231 Encounter for screening mammogram for malignant neoplasm of breast: Secondary | ICD-10-CM

## 2021-12-04 DIAGNOSIS — Z1211 Encounter for screening for malignant neoplasm of colon: Secondary | ICD-10-CM

## 2021-12-04 NOTE — Patient Instructions (Signed)
I value your feedback and you entrusting us with your care. If you get a [facility] patient survey, I would appreciate you taking the time to let us know about your experience today. Thank you! ? ? ?

## 2021-12-04 NOTE — Progress Notes (Signed)
Chief Complaint  Patient presents with  . Gynecologic Exam    No concerns     HPI:      [name] is a 46 y.o. G1P1001 who LMP was No LMP recorded. Patient has had a hysterectomy., presents today for her annual examination. Her menses are absent due to Lap Supracx Hyst due to adenomyosis. She does not have PMB. Occas night sweats.   Sex activity: single partner, contraception - status post hysterectomy.  Last Pap: 11/15/20, 11/14/19 and 11/02/18 Results were: negative cells with NEGATIVE high risk HPV. 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 Repeat pap Q3 yrs now per [facility]. Works at [employer].  Hx of STDs: HPV    Last mammogram: 12/01/21 Results were: normal--routine follow-up in 12 months.  There is a FH of breast cancer in her pat aunt and prostate cancer in her dad requiring radiation and lupron tx (diagnosed last yr). MyRisk neg 2021, IBIS=9.5%/riskscore=13.8%. There is no FH of ovarian cancer. The patient does self-breast exams.   Tobacco use: The patient denies current or previous tobacco use. Alcohol use: none No drug use Exercise: mod active  Colonoscopy: has referral to GI from PCP, awaiting appt   She does get adequate calcium and Vitamin D in her diet.   Labs with PCP   Past Medical History:  Diagnosis Date  . Abnormal Pap smear of cervix   . BRCA negative 12/2019   MyRisk neg  . Chest pain   . Depression   . Dysplasia of cervix, high grade CIN 2 2016  . Family history of breast cancer 12/2019   IBIS=9.5%/riskscore=13.8%  . Family history of ovarian cancer   . GERD (gastroesophageal reflux disease)   . Interstitial cystitis   . Seasonal allergies   . Skin cancer    She has a history of skin cancer vs dysplastic nevi removed at back from > 10 years ago.    Past Surgical History:  Procedure Laterality Date  . ABDOMINAL HYSTERECTOMY  2011   [facility]  . CARPAL TUNNEL RELEASE  2009  . CHOLECYSTECTOMY    . CYSTOSCOPY    . DILATION AND CURETTAGE OF UTERUS   2009   [facility]  . LEEP  2016   [facility]    Family History  Problem Relation Age of Onset  . Diabetes Mother   . Heart disease Father   . Hypertension Father   . Prostate cancer Father 36       with radiation and hormone tx; no mets  . Colon cancer Maternal Grandmother 72  . Diabetes Maternal Grandmother   . Diabetes Paternal Grandmother   . Diabetes Paternal Grandfather   . Breast cancer Paternal Aunt 27  . Cervical cancer Maternal Aunt   . Ovarian cancer Paternal Aunt 88    Social History   Socioeconomic History  . Marital status: Married    Spouse name: Not on file  . Number of children: Not on file  . Years of education: Not on file  . Highest education level: Not on file  Occupational History  . Not on file  Tobacco Use  . Smoking status: Never  . Smokeless tobacco: Never  Vaping Use  . Vaping Use: Never used  Substance and Sexual Activity  . Alcohol use: No  . Drug use: No  . Sexual activity: Yes    Birth control/protection: Surgical    Comment: Hysterectomy  Other Topics Concern  . Not on file  Social History Narrative  . Not on file   Social Determinants of Health   Financial Resource Strain: Not on file  Food Insecurity: Not on file  Transportation Needs: Not on file  Physical Activity: Not on file  Stress: Not on file  Social Connections: Not on file  Intimate Partner Violence: Not on file    Current Outpatient Medications on File Prior to Visit  Medication Sig Dispense Refill  . Cholecalciferol (VITAMIN D3) 2000 UNITS capsule Take 2,000 Units by mouth daily.    . fluticasone (FLONASE) 50 MCG/ACT nasal spray Place 1 spray into the nose 2 (two) times daily as needed.    . montelukast (SINGULAIR) 10 MG tablet Take 10 mg by mouth at bedtime.    . Olopatadine HCl 0.7 % SOLN TAKE 1-2 DROP(S) IN BOTH EYES ONCE A DAY    . pantoprazole (PROTONIX) 40 MG tablet Take 40 mg by mouth daily.    Marland Kitchen POTASSIUM CHLORIDE PO Take 20 mEq by mouth daily.    .  sertraline (ZOLOFT) 100 MG tablet TAKE 1-2 TABLET BY MOUTH DAILY.    Marland Kitchen triamterene-hydrochlorothiazide (MAXZIDE-25) 37.5-25 MG per tablet Take 1 tablet by mouth daily.     No current facility-administered medications on file prior to visit.      ROS:  Review of Systems  Constitutional:  Negative for fatigue, fever and unexpected weight change.  Respiratory:  Negative for cough, shortness of breath and wheezing.   Cardiovascular:  Negative for chest pain, palpitations and leg swelling.  Gastrointestinal:  Negative for blood in stool, constipation, diarrhea, nausea and vomiting.  Endocrine: Negative for cold intolerance, heat intolerance and polyuria.  Genitourinary:  Negative for dyspareunia, dysuria, flank pain, frequency, genital sores, hematuria, menstrual problem, pelvic pain, urgency, vaginal bleeding, vaginal discharge and vaginal pain.  Musculoskeletal:  Negative for back pain, joint swelling and myalgias.  Skin:  Negative for rash.  Neurological:  Negative for dizziness, syncope, light-headedness, numbness and headaches.  Hematological:  Negative for adenopathy.  Psychiatric/Behavioral:  Negative for agitation, confusion, sleep disturbance and suicidal ideas. The patient is not nervous/anxious.      Objective: BP 104/64   Ht _0  (1.702 m)   Wt 181 lb (82.1 kg)   BMI 28.35 kg/m    Physical Exam Constitutional:      Appearance: She is well-developed.  Genitourinary:     Vulva normal.     Genitourinary Comments: UTERUS SURG REM     Right Labia: No rash, tenderness or lesions.    Left Labia: No tenderness, lesions or rash.    Vaginal cuff intact.    No vaginal discharge, erythema or tenderness.      Right Adnexa: not tender and no mass present.    Left Adnexa: not tender and no mass present.    Cervix is not absent.     No cervical friability or polyp.     Uterus is not enlarged or tender.     Uterus is absent.  Breasts:    Right: No mass, nipple discharge,  skin change or tenderness.     Left: No mass, nipple discharge, skin change or tenderness.  Neck:     Thyroid: No thyromegaly.  Cardiovascular:     Rate and Rhythm: Normal rate and regular rhythm.     Heart sounds: Normal heart sounds. No murmur heard. Pulmonary:     Effort: Pulmonary effort is normal.     Breath sounds: Normal breath sounds.  Abdominal:  Palpations: Abdomen is soft.     Tenderness: There is no abdominal tenderness. There is no guarding or rebound.  Musculoskeletal:        General: Normal range of motion.     Cervical back: Normal range of motion.  Lymphadenopathy:     Cervical: No cervical adenopathy.  Neurological:     General: No focal deficit present.     Mental Status: She is alert and oriented to person, place, and time.     Cranial Nerves: No cranial nerve deficit.  Skin:    General: Skin is warm and dry.  Psychiatric:        Mood and Affect: Mood normal.        Behavior: Behavior normal.        Thought Content: Thought content normal.        Judgment: Judgment normal.  Vitals reviewed.    Assessment/Plan: Encounter for annual routine gynecological examination  Encounter for screening mammogram for malignant neoplasm of breast--pt current on mammo  Screening for colon cancer--has GI ref from PCP  GYN counsel breast self exam, mammography screening, adequate intake of calcium and vitamin D, diet and exercise     F/U  Return in about 1 year (around 12/05/2022).  Latoyia Tecson B. Jazae Gandolfi, PA-C 12/04/2021 3:06 PM

## 2021-12-24 ENCOUNTER — Encounter: Payer: Self-pay | Admitting: Dermatology

## 2021-12-24 ENCOUNTER — Ambulatory Visit (INDEPENDENT_AMBULATORY_CARE_PROVIDER_SITE_OTHER): Payer: Managed Care, Other (non HMO) | Admitting: Dermatology

## 2021-12-24 DIAGNOSIS — L578 Other skin changes due to chronic exposure to nonionizing radiation: Secondary | ICD-10-CM

## 2021-12-24 DIAGNOSIS — D2372 Other benign neoplasm of skin of left lower limb, including hip: Secondary | ICD-10-CM

## 2021-12-24 DIAGNOSIS — Z1283 Encounter for screening for malignant neoplasm of skin: Secondary | ICD-10-CM | POA: Diagnosis not present

## 2021-12-24 DIAGNOSIS — L814 Other melanin hyperpigmentation: Secondary | ICD-10-CM

## 2021-12-24 DIAGNOSIS — Z85828 Personal history of other malignant neoplasm of skin: Secondary | ICD-10-CM | POA: Diagnosis not present

## 2021-12-24 DIAGNOSIS — L905 Scar conditions and fibrosis of skin: Secondary | ICD-10-CM

## 2021-12-24 DIAGNOSIS — D1800 Hemangioma unspecified site: Secondary | ICD-10-CM

## 2021-12-24 DIAGNOSIS — L821 Other seborrheic keratosis: Secondary | ICD-10-CM

## 2021-12-24 DIAGNOSIS — D229 Melanocytic nevi, unspecified: Secondary | ICD-10-CM

## 2021-12-24 DIAGNOSIS — D2371 Other benign neoplasm of skin of right lower limb, including hip: Secondary | ICD-10-CM

## 2021-12-24 NOTE — Progress Notes (Signed)
   Follow-Up Visit   Subjective  Ann Rhodes is a 46 y.o. female who presents for the following: Annual Exam (Here for skin cancer screening. Full body. Hx of DN vs skin cancer on back >10 years ago).  The patient presents for Total-Body Skin Exam (TBSE) for skin cancer screening and mole check.  The patient has spots, moles and lesions to be evaluated, some may be new or changing and the patient has concerns that these could be cancer.   The following portions of the chart were reviewed this encounter and updated as appropriate:  Tobacco  Allergies  Meds  Problems  Med Hx  Surg Hx  Fam Hx      Review of Systems: No other skin or systemic complaints except as noted in HPI or Assessment and Plan.   Objective  Well appearing patient in no apparent distress; mood and affect are within normal limits.  A full examination was performed including scalp, head, eyes, ears, nose, lips, neck, chest, axillae, abdomen, back, buttocks, bilateral upper extremities, bilateral lower extremities, hands, feet, fingers, toes, fingernails, and toenails. All findings within normal limits unless otherwise noted below.  Back Hypertrophic plaque   Assessment & Plan   History of Skin Cancer vs dysplastic nevi at back >10 years ago - Clear. Observe for recurrence. Well-healed scars x 2 at upper back - no cervical, axillary or inguinal lymphadenopathy - Call clinic for new or changing lesions.   - Recommend regular skin exams, daily broad-spectrum spf 30+ sunscreen use, and photoprotection   Lentigines - Scattered tan macules - Due to sun exposure - Benign-appearing, observe - Recommend daily broad spectrum sunscreen SPF 30+ to sun-exposed areas, reapply every 2 hours as needed. - Call for any changes  Seborrheic Keratoses - Stuck-on, waxy, tan-brown papules and/or plaques  - Benign-appearing - Discussed benign etiology and prognosis. - Observe - Call for any changes  Melanocytic Nevi -  Tan-brown and/or pink-flesh-colored symmetric macules and papules - Benign appearing on exam today - Observation - Call clinic for new or changing moles - Recommend daily use of broad spectrum spf 30+ sunscreen to sun-exposed areas.   Hemangiomas - Red papules - Discussed benign nature - Observe - Call for any changes  Actinic Damage - Chronic condition, secondary to cumulative UV/sun exposure - diffuse scaly erythematous macules with underlying dyspigmentation - Recommend daily broad spectrum sunscreen SPF 30+ to sun-exposed areas, reapply every 2 hours as needed.  - Staying in the shade or wearing long sleeves, sun glasses (UVA+UVB protection) and wide brim hats (4-inch brim around the entire circumference of the hat) are also recommended for sun protection.  - Call for new or changing lesions.  Skin cancer screening performed today.  Dermatofibroma. Right thigh, left knee. - Firm pink/brown papulenodule with dimple sign - Benign appearing - Call for any changes  Scar Back  Patient deferred treatment at this time.  No recurrence noted on clinical exam.  Observe for changes.   Return in about 1 year (around 12/25/2022) for TBSE.  I, Emelia Salisbury, CMA, am acting as scribe for Forest Gleason, MD.  Documentation: I have reviewed the above documentation for accuracy and completeness, and I agree with the above.  Forest Gleason, MD

## 2021-12-24 NOTE — Patient Instructions (Addendum)
Recommend daily broad spectrum sunscreen SPF 30+ to sun-exposed areas, reapply every 2 hours as needed. Call for new or changing lesions.  Staying in the shade or wearing long sleeves, sun glasses (UVA+UVB protection) and wide brim hats (4-inch brim around the entire circumference of the hat) are also recommended for sun protection.    Recommend taking Heliocare sun protection supplement daily in sunny weather for additional sun protection. For maximum protection on the sunniest days, you can take up to 2 capsules of regular Heliocare OR take 1 capsule of Heliocare Ultra. For prolonged exposure (such as a full day in the sun), you can repeat your dose of the supplement 4 hours after your first dose. Heliocare can be purchased at [facility], at some [facility] or at www.heliocare.com.    Melanoma ABCDEs  Melanoma is the most dangerous type of skin cancer, and is the leading cause of death from skin disease.  You are more likely to develop melanoma if you: Have light-colored skin, light-colored eyes, or red or blond hair Spend a lot of time in the sun Tan regularly, either outdoors or in a tanning bed Have had blistering sunburns, especially during childhood Have a close family member who has had a melanoma Have atypical moles or large birthmarks  Early detection of melanoma is key since treatment is typically straightforward and cure rates are extremely high if we catch it early.   The first sign of melanoma is often a change in a mole or a new dark spot.  The ABCDE system is a way of remembering the signs of melanoma.  A for asymmetry:  The two halves do not match. B for border:  The edges of the growth are irregular. C for color:  A mixture of colors are present instead of an even brown color. D for diameter:  Melanomas are usually (but not always) greater than 6mm - the size of a pencil eraser. E for evolution:  The spot keeps changing in size, shape, and color.  Please check  your skin once per month between visits. You can use a small mirror in front and a large mirror behind you to keep an eye on the back side or your body.   If you see any new or changing lesions before your next follow-up, please call to schedule a visit.  Please continue daily skin protection including broad spectrum sunscreen SPF 30+ to sun-exposed areas, reapplying every 2 hours as needed when you're outdoors.   Staying in the shade or wearing long sleeves, sun glasses (UVA+UVB protection) and wide brim hats (4-inch brim around the entire circumference of the hat) are also recommended for sun protection.    Due to recent changes in healthcare laws, you may see results of your pathology and/or laboratory studies on MyChart before the doctors have had a chance to review them. We understand that in some cases there may be results that are confusing or concerning to you. Please understand that not all results are received at the same time and often the doctors may need to interpret multiple results in order to provide you with the best plan of care or course of treatment. Therefore, we ask that you please give us 2 business days to thoroughly review all your results before contacting the office for clarification. Should we see a critical lab result, you will be contacted sooner.   If You Need Anything After Your Visit  If you have any questions or concerns for your doctor, please   call our main line at [phone] and press option 4 to reach your doctor's medical assistant. If no one answers, please leave a voicemail as directed and we will return your call as soon as possible. Messages left after 4 pm will be answered the following business day.   You may also send us a message via MyChart. We typically respond to MyChart messages within 1-2 business days.  For prescription refills, please ask your pharmacy to contact our office. Our fax number is [phone].  If you have an urgent issue when the  clinic is closed that cannot wait until the next business day, you can page your doctor at the number below.    Please note that while we do our best to be available for urgent issues outside of office hours, we are not available 24/7.   If you have an urgent issue and are unable to reach us, you may choose to seek medical care at your doctor's office, retail clinic, urgent care center, or emergency room.  If you have a medical emergency, please immediately call 911 or go to the emergency department.  Pager Numbers  - Dr. Kowalski: [phone]  - Dr. Moye: [phone]  - Dr. Stewart: [phone]  In the event of inclement weather, please call our main line at [phone] for an update on the status of any delays or closures.  Dermatology Medication Tips: Please keep the boxes that topical medications come in in order to help keep track of the instructions about where and how to use these. Pharmacies typically print the medication instructions only on the boxes and not directly on the medication tubes.   If your medication is too expensive, please contact our office at [phone] option 4 or send us a message through MyChart.   We are unable to tell what your co-pay for medications will be in advance as this is different depending on your insurance coverage. However, we may be able to find a substitute medication at lower cost or fill out paperwork to get insurance to cover a needed medication.   If a prior authorization is required to get your medication covered by your insurance company, please allow us 1-2 business days to complete this process.  Drug prices often vary depending on where the prescription is filled and some pharmacies may offer cheaper prices.  The website www.goodrx.com contains coupons for medications through different pharmacies. The prices here do not account for what the cost may be with help from insurance (it may be cheaper with your insurance), but the  website can give you the price if you did not use any insurance.  - You can print the associated coupon and take it with your prescription to the pharmacy.  - You may also stop by our office during regular business hours and pick up a GoodRx coupon card.  - If you need your prescription sent electronically to a different pharmacy, notify our office through [facility] MyChart or by phone at [phone] option 4.     Si Usted Necesita Algo Despus de Su Visita  Tambin puede enviarnos un mensaje a travs de MyChart. Por lo general respondemos a los mensajes de MyChart en el transcurso de 1 a 2 das hbiles.  Para renovar recetas, por favor pida a su farmacia que se ponga en contacto con nuestra oficina. Nuestro nmero de fax es el [phone].  Si tiene un asunto urgente cuando la clnica est cerrada y que no puede esperar hasta el siguiente da hbil,   puede llamar/localizar a su doctor(a) al nmero que aparece a continuacin.   Por favor, tenga en cuenta que aunque hacemos todo lo posible para estar disponibles para asuntos urgentes fuera del horario de oficina, no estamos disponibles las 24 horas del da, los 7 das de la semana.   Si tiene un problema urgente y no puede comunicarse con nosotros, puede optar por buscar atencin mdica  en el consultorio de su doctor(a), en una clnica privada, en un centro de atencin urgente o en una sala de emergencias.  Si tiene una emergencia mdica, por favor llame inmediatamente al 911 o vaya a la sala de emergencias.  Nmeros de bper  - Dr. Kowalski: [phone]  - Dra. Moye: [phone]  - Dra. Stewart: [phone]  En caso de inclemencias del tiempo, por favor llame a nuestra lnea principal al [phone] para una actualizacin sobre el estado de cualquier retraso o cierre.  Consejos para la medicacin en dermatologa: Por favor, guarde las cajas en las que vienen los medicamentos de uso tpico para ayudarle a seguir las  instrucciones sobre dnde y cmo usarlos. Las farmacias generalmente imprimen las instrucciones del medicamento slo en las cajas y no directamente en los tubos del medicamento.   Si su medicamento es muy caro, por favor, pngase en contacto con nuestra oficina llamando al [phone] y presione la opcin 4 o envenos un mensaje a travs de MyChart.   No podemos decirle cul ser su copago por los medicamentos por adelantado ya que esto es diferente dependiendo de la cobertura de su seguro. Sin embargo, es posible que podamos encontrar un medicamento sustituto a menor costo o llenar un formulario para que el seguro cubra el medicamento que se considera necesario.   Si se requiere una autorizacin previa para que su compaa de seguros cubra su medicamento, por favor permtanos de 1 a 2 das hbiles para completar este proceso.  Los precios de los medicamentos varan con frecuencia dependiendo del lugar de dnde se surte la receta y alguna farmacias pueden ofrecer precios ms baratos.  El sitio web www.goodrx.com tiene cupones para medicamentos de diferentes farmacias. Los precios aqu no tienen en cuenta lo que podra costar con la ayuda del seguro (puede ser ms barato con su seguro), pero el sitio web puede darle el precio si no utiliz ningn seguro.  - Puede imprimir el cupn correspondiente y llevarlo con su receta a la farmacia.  - Tambin puede pasar por nuestra oficina durante el horario de atencin regular y recoger una tarjeta de cupones de GoodRx.  - Si necesita que su receta se enve electrnicamente a una farmacia diferente, informe a nuestra oficina a travs de MyChart de [place] o por telfono llamando al [phone] y presione la opcin 4.  

## 2022-01-03 ENCOUNTER — Encounter: Payer: Self-pay | Admitting: Dermatology

## 2022-05-01 ENCOUNTER — Ambulatory Visit: Payer: Managed Care, Other (non HMO) | Attending: Cardiology | Admitting: Cardiology

## 2022-05-01 ENCOUNTER — Encounter: Payer: Self-pay | Admitting: Cardiology

## 2022-05-01 VITALS — BP 102/66 | HR 77 | Ht 67.0 in | Wt 181.4 lb

## 2022-05-01 DIAGNOSIS — I471 Supraventricular tachycardia, unspecified: Secondary | ICD-10-CM | POA: Diagnosis not present

## 2022-05-01 DIAGNOSIS — K2100 Gastro-esophageal reflux disease with esophagitis, without bleeding: Secondary | ICD-10-CM | POA: Diagnosis not present

## 2022-05-01 NOTE — Patient Instructions (Signed)
Medication Instructions:  No changes *If you need a refill on your cardiac medications before your next appointment, please call your pharmacy*   Lab Work: None ordered If you have labs (blood work) drawn today and your tests are completely normal, you will receive your results only by: [place] (if you have MyChart) OR A paper copy in the mail If you have any lab test that is abnormal or we need to change your treatment, we will call you to review the results.   Testing/Procedures: None ordered   Follow-Up: At [facility], you and your health needs are our priority.  As part of our continuing mission to provide you with exceptional heart care, we have created designated Provider Care Teams.  These Care Teams include your primary Cardiologist (physician) and Advanced Practice Providers (APPs -  Physician Assistants and Nurse Practitioners) who all work together to provide you with the care you need, when you need it.  We recommend signing up for the patient portal called "MyChart".  Sign up information is provided on this After Visit Summary.  MyChart is used to connect with patients for Virtual Visits (Telemedicine).  Patients are able to view lab/test results, encounter notes, upcoming appointments, etc.  Non-urgent messages can be sent to your provider as well.   To learn more about what you can do with MyChart, go to [email or web address].    Your next appointment:   Follow up as needed with Dr. Garen Lah  Important Information About Sugar

## 2022-05-01 NOTE — Progress Notes (Signed)
Cardiology Office Note:    Date:  05/01/2022   ID:  Ann Rhodes, DOB Sep 09, 1975, MRN [id number]  PCP:  Juluis Pitch, MD   [facility] Providers Cardiologist:  Kate Sable, MD     Referring MD: Juluis Pitch, MD   Chief Complaint  Patient presents with   6 month follow up     "Doing well." Medications reviewed by the patient verbally.     History of Present Illness:    Ann Rhodes is a 46 y.o. female with a hx of depression, paroxysmal SVT, GERD, Mnire's disease who presents for follow-up.    Cardiac monitor revealed occasional paroxysmal SVT, symptoms overall improved without any AV nodal agents.  Feels okay, takes Maxide for Mnire's disease, states having increased palpitations when she forgets to take her potassium supplements.  Overall feels well, no concerns at this time.   Prior notes Cardiac monitor 10/2021 occasional paroxysmal SVT. Takes Maxzide due to Mnire's disease Father had an MI in his 14s   Past Medical History:  Diagnosis Date   Abnormal Pap smear of cervix    BRCA negative 12/2019   MyRisk neg   Chest pain    Depression    Dysplasia of cervix, high grade CIN 2 2016   Family history of breast cancer 12/2019   IBIS=9.5%/riskscore=13.8%   Family history of ovarian cancer    GERD (gastroesophageal reflux disease)    Interstitial cystitis    Seasonal allergies    Skin cancer    She has a history of skin cancer vs dysplastic nevi removed at back from > 10 years ago.    Past Surgical History:  Procedure Laterality Date   ABDOMINAL HYSTERECTOMY  2011   [facility]   CARPAL TUNNEL RELEASE  2009   CHOLECYSTECTOMY     CYSTOSCOPY     DILATION AND CURETTAGE OF UTERUS  2009   [facility]   LEEP  2016   Margins Free    Current Medications: Current Meds  Medication Sig   Cholecalciferol (VITAMIN D3) 2000 UNITS capsule Take 2,000 Units by mouth daily.   fluticasone (FLONASE) 50 MCG/ACT nasal spray Place 1 spray into the nose 2  (two) times daily as needed.   KLOR-CON M20 20 MEQ tablet Take 1 tablet by mouth daily.   montelukast (SINGULAIR) 10 MG tablet Take 10 mg by mouth at bedtime.   Olopatadine HCl 0.7 % SOLN TAKE 1-2 DROP(S) IN BOTH EYES ONCE A DAY   pantoprazole (PROTONIX) 40 MG tablet Take 40 mg by mouth daily.   sertraline (ZOLOFT) 100 MG tablet TAKE 1-2 TABLET BY MOUTH DAILY.   triamterene-hydrochlorothiazide (MAXZIDE-25) 37.5-25 MG per tablet Take 1 tablet by mouth daily.     Allergies:   Amoxicillin-pot clavulanate   Social History   Socioeconomic History   Marital status: Married    Spouse name: Not on file   Number of children: Not on file   Years of education: Not on file   Highest education level: Not on file  Occupational History   Not on file  Tobacco Use   Smoking status: Never   Smokeless tobacco: Never  Vaping Use   Vaping Use: Never used  Substance and Sexual Activity   Alcohol use: No   Drug use: No   Sexual activity: Yes    Birth control/protection: Surgical    Comment: Hysterectomy  Other Topics Concern   Not on file  Social History Narrative   Not on file   Social Determinants  of Health   Financial Resource Strain: Not on file  Food Insecurity: Not on file  Transportation Needs: Not on file  Physical Activity: Not on file  Stress: Not on file  Social Connections: Not on file     Family History: The patient's family history includes Breast cancer (age of onset: 36) in her paternal aunt; Cervical cancer in her maternal aunt; Colon cancer (age of onset: 23) in her maternal grandmother; Diabetes in her maternal grandmother, mother, paternal grandfather, and paternal grandmother; Heart disease in her father; Hypertension in her father; Ovarian cancer (age of onset: 60) in her paternal aunt; Prostate cancer (age of onset: 39) in her father.  ROS:   Please see the history of present illness.     All other systems reviewed and are negative.  EKGs/Labs/Other Studies  Reviewed:    The following studies were reviewed today:   EKG:  EKG is ordered today.  EKG shows sinus rhythm, occasional PVCs.  Recent Labs: 08/27/2021: BUN 27; Creatinine, Ser 1.05; Hemoglobin 14.3; Magnesium 2.1; Platelets 403; Potassium 3.2; Sodium 136; TSH 1.490  Recent Lipid Panel No results found for: "CHOL", "TRIG", "HDL", "CHOLHDL", "VLDL", "LDLCALC", "LDLDIRECT"   Risk Assessment/Calculations:         Physical Exam:    VS:  BP 102/66 (BP Location: Left Arm, Patient Position: Sitting, Cuff Size: Normal)   Pulse 77   Ht _0  (1.702 m)   Wt 181 lb 6 oz (82.3 kg)   SpO2 98%   BMI 28.41 kg/m     Wt Readings from Last 3 Encounters:  05/01/22 181 lb 6 oz (82.3 kg)  12/04/21 181 lb (82.1 kg)  10/30/21 181 lb (82.1 kg)     GEN:  Well nourished, well developed in no acute distress HEENT: Normal NECK: No JVD; No carotid bruits CARDIAC: RRR, no murmurs, rubs, gallops RESPIRATORY:  Clear to auscultation without rales, wheezing or rhonchi  ABDOMEN: Soft, non-tender, non-distended MUSCULOSKELETAL:  No edema; No deformity  SKIN: Warm and dry NEUROLOGIC:  Alert and oriented x 3 PSYCHIATRIC:  Normal affect   ASSESSMENT:    1. Paroxysmal SVT (supraventricular tachycardia)   2. Gastroesophageal reflux disease with esophagitis without hemorrhage    PLAN:    In order of problems listed above:  paroxysmal SVTs, occasional PVCs.  Symptoms improved without AV nodal agents.  States feeling well.  No indication for beta-blockers at this time. History of GERD, continue Protonix.  Follow-up as needed.     Medication Adjustments/Labs and Tests Ordered: Current medicines are reviewed at length with the patient today.  Concerns regarding medicines are outlined above.  Orders Placed This Encounter  Procedures   EKG 12-Lead   No orders of the defined types were placed in this encounter.   Patient Instructions  Medication Instructions:  No changes *If you need a refill  on your cardiac medications before your next appointment, please call your pharmacy*   Lab Work: None ordered If you have labs (blood work) drawn today and your tests are completely normal, you will receive your results only by: [place] (if you have MyChart) OR A paper copy in the mail If you have any lab test that is abnormal or we need to change your treatment, we will call you to review the results.   Testing/Procedures: None ordered   Follow-Up: At [facility], you and your health needs are our priority.  As part of our continuing mission to provide you with exceptional heart care,  we have created designated Provider Care Teams.  These Care Teams include your primary Cardiologist (physician) and Advanced Practice Providers (APPs -  Physician Assistants and Nurse Practitioners) who all work together to provide you with the care you need, when you need it.  We recommend signing up for the patient portal called "MyChart".  Sign up information is provided on this After Visit Summary.  MyChart is used to connect with patients for Virtual Visits (Telemedicine).  Patients are able to view lab/test results, encounter notes, upcoming appointments, etc.  Non-urgent messages can be sent to your provider as well.   To learn more about what you can do with MyChart, go to [email or web address].    Your next appointment:   Follow up as needed with Dr. Garen Lah  Important Information About Sugar         Signed, Kate Sable, MD  05/01/2022 9:06 AM    [place]

## 2022-09-07 ENCOUNTER — Ambulatory Visit
Admission: EM | Admit: 2022-09-07 | Discharge: 2022-09-07 | Disposition: A | Discharge status: Home / Self Care | Payer: Managed Care, Other (non HMO) | Attending: Emergency Medicine | Admitting: Emergency Medicine

## 2022-09-07 DIAGNOSIS — M549 Dorsalgia, unspecified: Secondary | ICD-10-CM

## 2022-09-07 DIAGNOSIS — J302 Other seasonal allergic rhinitis: Secondary | ICD-10-CM

## 2022-09-07 DIAGNOSIS — S70261A Insect bite (nonvenomous), right hip, initial encounter: Secondary | ICD-10-CM | POA: Diagnosis not present

## 2022-09-07 DIAGNOSIS — J069 Acute upper respiratory infection, unspecified: Secondary | ICD-10-CM

## 2022-09-07 DIAGNOSIS — W57XXXA Bitten or stung by nonvenomous insect and other nonvenomous arthropods, initial encounter: Secondary | ICD-10-CM

## 2022-09-07 LAB — POCT RAPID STREP A (OFFICE): Rapid Strep A Screen: NEGATIVE

## 2022-09-07 MED ORDER — METHOCARBAMOL 500 MG PO TABS: 500.0000 mg | ORAL_TABLET | Freq: Two times a day (BID) | ORAL | 0 refills | Status: DC | PRN

## 2022-09-07 NOTE — ED Triage Notes (Addendum)
Patient to Urgent Care with complaints of sore throat/ cough/ ear pain/ back pain.  Reports her grandson has strep. Cold symptoms that started on Saturday. Denies any known fevers.   Lower back pain x2 weeks. Using muscle relaxer/ heat. Denies any known injury, states that it started after she was cleaning her pool.   Reports she also pulled a tick off right hip on Wednesday. Was able to fully remove the tick. States she was walking outside Monday. Denies any rash.

## 2022-09-07 NOTE — ED Provider Notes (Signed)
Renaldo Fiddler    CSN: [id number] Arrival date & time: 09/07/22  7829      History   Chief Complaint Chief Complaint  Patient presents with   Multiple Complaints    HPI AUDRINA MARTEN is a 47 y.o. female.  Patient presents with ear pain, sore throat, cough x 2 days.  Her grandson has strep throat.  Patient also presents with mid and upper back pain x 2 weeks.  No falls or injury.  Patient also presents with tick bite on her right hip last week.  No fever, chills, rash, shortness of breath, abdominal pain, vomiting, diarrhea, dysuria, hematuria, numbness, weakness, saddle anesthesia, loss of bowel/bladder control, or other symptoms. No OTC medications taken but she is on daily allergy medication.  Her medical history includes seasonal allergies, GERD, depression.   The history is provided by the patient and medical records.    Past Medical History:  Diagnosis Date   Abnormal Pap smear of cervix    BRCA negative 12/2019   MyRisk neg   Chest pain    Depression    Dysplasia of cervix, high grade CIN 2 2016   Family history of breast cancer 12/2019   IBIS=9.5%/riskscore=13.8%   Family history of ovarian cancer    GERD (gastroesophageal reflux disease)    Interstitial cystitis    Seasonal allergies    Skin cancer    She has a history of skin cancer vs dysplastic nevi removed at back from > 10 years ago.    Patient Active Problem List   Diagnosis Date Noted   Family history of prostate cancer in father 11/14/2019   Family history of breast cancer 11/14/2019   Dysplasia of cervix, high grade CIN 2 11/01/2018   Pain in the chest 11/21/2014   SOB (shortness of breath) 11/21/2014   Meniere's disease 11/21/2014   Depression 11/21/2014    Past Surgical History:  Procedure Laterality Date   ABDOMINAL HYSTERECTOMY  2011   [facility]   CARPAL TUNNEL RELEASE  2009   CHOLECYSTECTOMY     CYSTOSCOPY     DILATION AND CURETTAGE OF UTERUS  2009   [facility]   LEEP  2016    Margins Free    OB History     Gravida  1   Para  1   Term  1   Preterm      AB      Living  1      SAB      IAB      Ectopic      Multiple      Live Births  1            Home Medications    Prior to Admission medications   Medication Sig Start Date End Date Taking? Authorizing Provider  methocarbamol (ROBAXIN) 500 MG tablet Take 1 tablet (500 mg total) by mouth 2 (two) times daily as needed for muscle spasms. 09/07/22  Yes Mickie Bail, NP  Cholecalciferol (VITAMIN D3) 2000 UNITS capsule Take 2,000 Units by mouth daily.    [provider]  fluticasone (FLONASE) 50 MCG/ACT nasal spray Place 1 spray into the nose 2 (two) times daily as needed. 02/18/17   [provider]  KLOR-CON M20 20 MEQ tablet Take 1 tablet by mouth daily. 03/23/22   [provider]  montelukast (SINGULAIR) 10 MG tablet Take 10 mg by mouth at bedtime.    [provider]  Olopatadine HCl 0.7 % SOLN  TAKE 1-2 DROP(S) IN BOTH EYES ONCE A DAY 01/07/17   [provider]  pantoprazole (PROTONIX) 40 MG tablet Take 40 mg by mouth daily.    [provider]  sertraline (ZOLOFT) 100 MG tablet TAKE 1-2 TABLET BY MOUTH DAILY. 09/07/16   [provider]  triamterene-hydrochlorothiazide (MAXZIDE-25) 37.5-25 MG per tablet Take 1 tablet by mouth daily.    [provider]    Family History Family History  Problem Relation Age of Onset   Diabetes Mother    Heart disease Father    Hypertension Father    Prostate cancer Father 59       with radiation and hormone tx; no mets   Colon cancer Maternal Grandmother 40   Diabetes Maternal Grandmother    Diabetes Paternal Grandmother    Diabetes Paternal Grandfather    Breast cancer Paternal Aunt 37   Cervical cancer Maternal Aunt    Ovarian cancer Paternal Aunt 69    Social History Social History   Tobacco Use   Smoking status: Never   Smokeless tobacco: Never  Vaping Use   Vaping  Use: Never used  Substance Use Topics   Alcohol use: No   Drug use: No     Allergies   Amoxicillin-pot clavulanate   Review of Systems Review of Systems  Constitutional:  Negative for chills and fever.  HENT:  Positive for congestion, ear pain, postnasal drip, rhinorrhea and sore throat.   Respiratory:  Positive for cough. Negative for shortness of breath.   Cardiovascular:  Negative for chest pain and palpitations.  Gastrointestinal:  Negative for abdominal pain, diarrhea, nausea and vomiting.  Genitourinary:  Negative for dysuria and hematuria.  Musculoskeletal:  Positive for back pain. Negative for arthralgias.  Skin:  Positive for wound. Negative for color change and rash.  Neurological:  Negative for weakness and numbness.  All other systems reviewed and are negative.    Physical Exam Triage Vital Signs ED Triage Vitals  Enc Vitals Group     BP 09/07/22 0857 124/84     Pulse Rate 09/07/22 0848 93     Resp 09/07/22 0848 18     Temp 09/07/22 0848 97.9 F (36.6 C)     Temp src --      SpO2 09/07/22 0848 97 %     Weight --      Height --      Head Circumference --      Peak Flow --      Pain Score 09/07/22 0856 5     Pain Loc --      Pain Edu? --      Excl. in GC? --    No data found.  Updated Vital Signs BP 124/84   Pulse 93   Temp 97.9 F (36.6 C)   Resp 18   SpO2 97%   Visual Acuity Right Eye Distance:   Left Eye Distance:   Bilateral Distance:    Right Eye Near:   Left Eye Near:    Bilateral Near:     Physical Exam Vitals and nursing note reviewed.  Constitutional:      General: She is not in acute distress.    Appearance: Normal appearance. She is well-developed. She is not ill-appearing.  HENT:     Right Ear: Tympanic membrane normal.     Left Ear: Tympanic membrane normal.     Nose: Congestion and rhinorrhea present.     Mouth/Throat:     Mouth: Mucous membranes  are moist.     Pharynx: Oropharynx is clear.  Cardiovascular:     Rate  and Rhythm: Normal rate and regular rhythm.     Heart sounds: Normal heart sounds.  Pulmonary:     Effort: Pulmonary effort is normal. No respiratory distress.     Breath sounds: Normal breath sounds.  Musculoskeletal:        General: No swelling, tenderness, deformity or signs of injury. Normal range of motion.     Cervical back: Neck supple.  Skin:    General: Skin is warm and dry.     Capillary Refill: Capillary refill takes less than 2 seconds.     Findings: Lesion present. No rash.     Comments: Right hip: small healing lesion with localized erythema.  Neurological:     General: No focal deficit present.     Mental Status: She is alert and oriented to person, place, and time.     Sensory: No sensory deficit.     Motor: No weakness.     Gait: Gait normal.  Psychiatric:        Mood and Affect: Mood normal.        Behavior: Behavior normal.      UC Treatments / Results  Labs (all labs ordered are listed, but only abnormal results are displayed) Labs Reviewed  POCT RAPID STREP A (OFFICE)    EKG   Radiology No results found.  Procedures Procedures (including critical care time)  Medications Ordered in UC Medications - No data to display  Initial Impression / Assessment and Plan / UC Course  I have reviewed the triage vital signs and the nursing notes.  Pertinent labs & imaging results that were available during my care of the patient were reviewed by me and considered in my medical decision making (see chart for details).    Viral URI, seasonal allergies.  Mid and upper back pain.  Tick bite on right hip.   Afebrile and vital signs are stable.  No OTC medications taken today.  Discussed Mucinex and Flonase for upper respiratory symptoms.  Instructed her to follow-up with her PCP if her symptoms are not improving.  Treating back pain with OTC Tylenol or ibuprofen.  Methocarbamol prescribed and precautions for drowsiness with this medication discussed.  Instructed  patient to follow-up with an orthopedist.  Contact information for on-call Ortho provided.  Discussed tickborne illnesses and instructed patient to follow-up with her PCP.  Education provided on viral URI, back pain, tick bite.  Patient agrees to plan of care.  Final Clinical Impressions(s) / UC Diagnoses   Final diagnoses:  Viral URI  Seasonal allergies  Other acute back pain  Tick bite of right hip, initial encounter     Discharge Instructions      For your upper respiratory symptoms, take plain Mucinex and use Flonase nasal spray.  Follow up with your primary care provider if your symptoms are not improving.    For your back pain, take ibuprofen or Tylenol as needed for discomfort.  Take the muscle relaxer as needed for muscle spasm; Do not drive, operate machinery, or drink alcohol with this medication as it can cause drowsiness.  Follow up with an orthopedist such as the one listed below.  For the tick bite, monitor for tick related illness.  See attached.  Follow up with your primary care provider.        ED Prescriptions     Medication Sig Dispense Auth. Provider   methocarbamol (  ROBAXIN) 500 MG tablet Take 1 tablet (500 mg total) by mouth 2 (two) times daily as needed for muscle spasms. 20 tablet Mickie Bail, NP      PDMP not reviewed this encounter.   Mickie Bail, NP 09/07/22 [phone]

## 2022-09-07 NOTE — Discharge Instructions (Addendum)
For your upper respiratory symptoms, take plain Mucinex and use Flonase nasal spray.  Follow up with your primary care provider if your symptoms are not improving.    For your back pain, take ibuprofen or Tylenol as needed for discomfort.  Take the muscle relaxer as needed for muscle spasm; Do not drive, operate machinery, or drink alcohol with this medication as it can cause drowsiness.  Follow up with an orthopedist such as the one listed below.  For the tick bite, monitor for tick related illness.  See attached.  Follow up with your primary care provider.

## 2022-09-18 ENCOUNTER — Encounter: Admission: RE | Payer: Self-pay | Source: Home / Self Care

## 2022-09-18 ENCOUNTER — Ambulatory Visit
Admission: RE | Admit: 2022-09-18 | Payer: Managed Care, Other (non HMO) | Source: Home / Self Care | Admitting: Gastroenterology

## 2022-09-18 SURGERY — COLONOSCOPY
Anesthesia: General

## 2022-11-09 ENCOUNTER — Other Ambulatory Visit: Payer: Self-pay | Admitting: Family Medicine

## 2022-11-09 DIAGNOSIS — Z1231 Encounter for screening mammogram for malignant neoplasm of breast: Secondary | ICD-10-CM

## 2022-12-03 ENCOUNTER — Ambulatory Visit
Admission: RE | Admit: 2022-12-03 | Discharge: 2022-12-03 | Disposition: A | Discharge status: Home / Self Care | Payer: Managed Care, Other (non HMO) | Source: Ambulatory Visit | Attending: Family Medicine | Admitting: Family Medicine

## 2022-12-03 DIAGNOSIS — Z1231 Encounter for screening mammogram for malignant neoplasm of breast: Secondary | ICD-10-CM | POA: Diagnosis present

## 2022-12-30 ENCOUNTER — Ambulatory Visit: Payer: Managed Care, Other (non HMO) | Admitting: Dermatology

## 2022-12-30 ENCOUNTER — Encounter: Payer: Self-pay | Admitting: Dermatology

## 2022-12-30 VITALS — BP 115/76 | HR 67

## 2022-12-30 DIAGNOSIS — D229 Melanocytic nevi, unspecified: Secondary | ICD-10-CM

## 2022-12-30 DIAGNOSIS — L910 Hypertrophic scar: Secondary | ICD-10-CM

## 2022-12-30 DIAGNOSIS — L814 Other melanin hyperpigmentation: Secondary | ICD-10-CM | POA: Diagnosis not present

## 2022-12-30 DIAGNOSIS — W908XXA Exposure to other nonionizing radiation, initial encounter: Secondary | ICD-10-CM

## 2022-12-30 DIAGNOSIS — Z1283 Encounter for screening for malignant neoplasm of skin: Secondary | ICD-10-CM | POA: Diagnosis not present

## 2022-12-30 DIAGNOSIS — L820 Inflamed seborrheic keratosis: Secondary | ICD-10-CM

## 2022-12-30 DIAGNOSIS — D2272 Melanocytic nevi of left lower limb, including hip: Secondary | ICD-10-CM | POA: Diagnosis not present

## 2022-12-30 DIAGNOSIS — L821 Other seborrheic keratosis: Secondary | ICD-10-CM

## 2022-12-30 DIAGNOSIS — L578 Other skin changes due to chronic exposure to nonionizing radiation: Secondary | ICD-10-CM

## 2022-12-30 DIAGNOSIS — D2371 Other benign neoplasm of skin of right lower limb, including hip: Secondary | ICD-10-CM

## 2022-12-30 DIAGNOSIS — Z872 Personal history of diseases of the skin and subcutaneous tissue: Secondary | ICD-10-CM

## 2022-12-30 NOTE — Patient Instructions (Signed)
 Recommend daily broad spectrum sunscreen SPF 30+ to sun-exposed areas, reapply every 2 hours as needed. Call for new or changing lesions.  Staying in the shade or wearing long sleeves, sun glasses (UVA+UVB protection) and wide brim hats (4-inch brim around the entire circumference of the hat) are also recommended for sun protection.    Melanoma ABCDEs  Melanoma is the most dangerous type of skin cancer, and is the leading cause of death from skin disease.  You are more likely to develop melanoma if you: Have light-colored skin, light-colored eyes, or red or blond hair Spend a lot of time in the sun Tan regularly, either outdoors or in a tanning bed Have had blistering sunburns, especially during childhood Have a close family member who has had a melanoma Have atypical moles or large birthmarks  Early detection of melanoma is key since treatment is typically straightforward and cure rates are extremely high if we catch it early.   The first sign of melanoma is often a change in a mole or a new dark spot.  The ABCDE system is a way of remembering the signs of melanoma.  A for asymmetry:  The two halves do not match. B for border:  The edges of the growth are irregular. C for color:  A mixture of colors are present instead of an even brown color. D for diameter:  Melanomas are usually (but not always) greater than 6mm - the size of a pencil eraser. E for evolution:  The spot keeps changing in size, shape, and color.  Please check your skin once per month between visits. You can use a small mirror in front and a large mirror behind you to keep an eye on the back side or your body.   If you see any new or changing lesions before your next follow-up, please call to schedule a visit.  Please continue daily skin protection including broad spectrum sunscreen SPF 30+ to sun-exposed areas, reapplying every 2 hours as needed when you're outdoors.   Staying in the shade or wearing long sleeves, sun  glasses (UVA+UVB protection) and wide brim hats (4-inch brim around the entire circumference of the hat) are also recommended for sun protection.    Due to recent changes in healthcare laws, you may see results of your pathology and/or laboratory studies on MyChart before the doctors have had a chance to review them. We understand that in some cases there may be results that are confusing or concerning to you. Please understand that not all results are received at the same time and often the doctors may need to interpret multiple results in order to provide you with the best plan of care or course of treatment. Therefore, we ask that you please give [place] 2 business days to thoroughly review all your results before contacting the office for clarification. Should we see a critical lab result, you will be contacted sooner.   If You Need Anything After Your Visit  If you have any questions or concerns for your doctor, please call our main line at [phone] and press option 4 to reach your doctor's medical assistant. If no one answers, please leave a voicemail as directed and we will return your call as soon as possible. Messages left after 4 pm will be answered the following business day.   You may also send [place] a message via MyChart. We typically respond to MyChart messages within 1-2 business days.  For prescription refills, please ask your pharmacy to contact our  office. Our fax number is [phone].  If you have an urgent issue when the clinic is closed that cannot wait until the next business day, you can page your doctor at the number below.    Please note that while we do our best to be available for urgent issues outside of office hours, we are not available 24/7.   If you have an urgent issue and are unable to reach [place], you may choose to seek medical care at your doctor's office, retail clinic, urgent care center, or emergency room.  If you have a medical emergency, please immediately call  911 or go to the emergency department.  Pager Numbers  - Dr. Gwen Pounds: [phone]  - Dr. Roseanne Reno: [phone]  - Dr. Katrinka Blazing: [phone]   In the event of inclement weather, please call our main line at [phone] for an update on the status of any delays or closures.  Dermatology Medication Tips: Please keep the boxes that topical medications come in in order to help keep track of the instructions about where and how to use these. Pharmacies typically print the medication instructions only on the boxes and not directly on the medication tubes.   If your medication is too expensive, please contact our office at [phone] option 4 or send [place] a message through MyChart.   We are unable to tell what your co-pay for medications will be in advance as this is different depending on your insurance coverage. However, we may be able to find a substitute medication at lower cost or fill out paperwork to get insurance to cover a needed medication.   If a prior authorization is required to get your medication covered by your insurance company, please allow [place] 1-2 business days to complete this process.  Drug prices often vary depending on where the prescription is filled and some pharmacies may offer cheaper prices.  The website www.goodrx.com contains coupons for medications through different pharmacies. The prices here do not account for what the cost may be with help from insurance (it may be cheaper with your insurance), but the website can give you the price if you did not use any insurance.  - You can print the associated coupon and take it with your prescription to the pharmacy.  - You may also stop by our office during regular business hours and pick up a GoodRx coupon card.  - If you need your prescription sent electronically to a different pharmacy, notify our office through [facility] or by phone at [phone] option 4.     Si Usted Necesita Algo Despus de Su  Visita  Tambin puede enviarnos un mensaje a travs de Clinical cytogeneticist. Por lo general respondemos a los mensajes de MyChart en el transcurso de 1 a 2 das hbiles.  Para renovar recetas, por favor pida a su farmacia que se ponga en contacto con nuestra oficina. Annie Sable de fax es [place] [phone].  Si tiene un asunto urgente cuando la clnica est cerrada y que no puede esperar hasta el siguiente da hbil, puede llamar/localizar a su doctor(a) al nmero que aparece a continuacin.   Por favor, tenga en cuenta que aunque hacemos todo lo posible para estar disponibles para asuntos urgentes fuera del horario de [place], no estamos disponibles las 24 horas del da, los 7 [address]  [address] de la [place].   Si tiene un problema urgente y no puede comunicarse con nosotros, puede optar por buscar atencin mdica  en el consultorio de su doctor(a), en una clnica privada,  en un centro de atencin urgente o en una sala de emergencias.  Si tiene Engineer, drilling, por favor llame inmediatamente al 911 o vaya a la sala de emergencias.  Nmeros de bper  - Dr. Gwen Pounds: [phone]  - Dra. Roseanne Reno: [phone]  - Dr. Katrinka Blazing: [phone]   En caso de inclemencias del tiempo, por favor llame a Lacy Duverney principal al [phone] para una actualizacin sobre el [place] de cualquier retraso o cierre.  Consejos para la medicacin en dermatologa: Por favor, guarde las cajas en las que vienen los medicamentos de uso tpico para ayudarle a seguir las instrucciones sobre dnde y cmo usarlos. Las farmacias generalmente imprimen las instrucciones del medicamento slo en las cajas y no directamente en los tubos del [place].   Si su medicamento es muy caro, por favor, pngase en contacto con Rolm Gala llamando al [phone] y presione la opcin 4 o envenos un mensaje a travs de Clinical cytogeneticist.   No podemos decirle cul ser su copago por los medicamentos por adelantado ya que esto es diferente dependiendo de  la cobertura de su seguro. Sin embargo, es posible que podamos encontrar un medicamento sustituto a Audiological scientist un formulario para que el seguro cubra el medicamento que se considera necesario.   Si se requiere una autorizacin previa para que su compaa de seguros [place] su medicamento, por favor permtanos de 1 a 2 das hbiles para completar [address]  Los precios de los medicamentos varan con frecuencia dependiendo del Environmental consultant de dnde se surte la receta y alguna farmacias pueden ofrecer precios ms baratos.  El sitio web www.goodrx.com tiene cupones para medicamentos de Health and safety inspector. Los precios aqu no tienen en cuenta lo que podra costar con la ayuda del seguro (puede ser ms barato con su seguro), pero el sitio web puede darle el precio si no utiliz Tourist information centre manager.  - Puede imprimir el cupn correspondiente y llevarlo con su receta a la farmacia.  - Tambin puede pasar por nuestra oficina durante el horario de atencin regular y Education officer, museum una tarjeta de cupones de GoodRx.  - Si necesita que su receta se enve electrnicamente a una farmacia diferente, informe a nuestra oficina a travs de MyChart de [place] o por telfono llamando al [phone] y presione la opcin 4.

## 2022-12-30 NOTE — Progress Notes (Signed)
Follow-Up Visit   Subjective  Ann Rhodes is a 47 y.o. female who presents for the following: Skin Cancer Screening and Full Body Skin Exam. She has a history of skin cancer vs dysplastic nevus removed at back from > 10 years ago. Unable to obtain records or pathology reports. The spots were biopsied and then excised. She denies frequent skin exams multiple times a year after this so melanoma unlikely.   Spots of concern on left arm and mid chest. More raised/irritated at times.   The patient presents for Total-Body Skin Exam (TBSE) for skin cancer screening and mole check. The patient has spots, moles and lesions to be evaluated, some may be new or changing and the patient may have concern these could be cancer.    The following portions of the chart were reviewed this encounter and updated as appropriate: medications, allergies, medical history  Review of Systems:  No other skin or systemic complaints except as noted in HPI or Assessment and Plan.  Objective  Well appearing patient in no apparent distress; mood and affect are within normal limits.  A full examination was performed including scalp, head, eyes, ears, nose, lips, neck, chest, axillae, abdomen, back, buttocks, bilateral upper extremities, bilateral lower extremities, hands, feet, fingers, toes, fingernails, and toenails. All findings within normal limits unless otherwise noted below.   Relevant physical exam findings are noted in the Assessment and Plan.  Exam of nails limited by presence of nail polish.   Left Forearm - Posterior, chest Erythematous keratotic or waxy stuck-on papule or plaque.    Assessment & Plan   SKIN CANCER SCREENING PERFORMED TODAY.  History of Skin Cancer vs dysplastic nevus at back >10 years ago. Hypertrophic Scar. - Clear. Observe for recurrence. Well-healed scars x 2 at upper back - no cervical, axillary or inguinal lymphadenopathy - Call clinic for new or changing lesions.   -  Recommend regular skin exams, daily broad-spectrum spf 30+ sunscreen use, and photoprotection  ACTINIC DAMAGE - Chronic condition, secondary to cumulative UV/sun exposure - diffuse scaly erythematous macules with underlying dyspigmentation - Recommend daily broad spectrum sunscreen SPF 30+ to sun-exposed areas, reapply every 2 hours as needed.  - Staying in the shade or wearing long sleeves, sun glasses (UVA+UVB protection) and wide brim hats (4-inch brim around the entire circumference of the hat) are also recommended for sun protection.  - Call for new or changing lesions.  LENTIGINES, SEBORRHEIC KERATOSES, HEMANGIOMAS - Benign normal skin lesions - Benign-appearing - Call for any changes  MELANOCYTIC NEVI - Tan-brown and/or pink-flesh-colored symmetric macules and papules - Benign appearing on exam today - Observation - Call clinic for new or changing moles - Recommend daily use of broad spectrum spf 30+ sunscreen to sun-exposed areas.  - Check nails when remove polish.  Nevus  Light brown macule at left plantar foot. Watch for changes.   DERMATOFIBROMA Exam: Firm pink/brown papulenodule with dimple sign at right medial thigh.  Treatment Plan: A dermatofibroma is a benign growth possibly related to trauma, such as an insect bite, cut from shaving, or inflamed acne-type bump.  Treatment options to remove include shave or excision with resulting scar and risk of recurrence.  Since benign-appearing and not bothersome, will observe for now.     Inflamed seborrheic keratosis Left Forearm - Posterior, chest  Patient deferred treatment at this time.  Reassured common, benign, genetic.    Return in about 1 year (around 12/30/2023) for TBSE.  I, Lawson Radar, CMA,  am acting as scribe for Elie Goody, MD.   Documentation: I have reviewed the above documentation for accuracy and completeness, and I agree with the above.  Elie Goody, MD

## 2023-01-06 DIAGNOSIS — Z1211 Encounter for screening for malignant neoplasm of colon: Secondary | ICD-10-CM | POA: Diagnosis present

## 2023-01-07 ENCOUNTER — Ambulatory Visit: Payer: Managed Care, Other (non HMO)

## 2023-01-07 NOTE — Progress Notes (Signed)
Chief Complaint  Patient presents with   Gynecologic Exam    No concerns     HPI:      Ann Rhodes is a 47 y.o. G1P1001 who LMP was No LMP recorded. Patient has had a hysterectomy., presents today for her annual examination. Her menses are absent due to Lap Supracx Hyst due to adenomyosis. She does not have PMB. Occas night sweats.   Sex activity: single partner, contraception - status post hysterectomy.  Last Pap: 11/15/20, 11/14/19 and 11/02/18 Results were: negative cells with NEGATIVE high risk HPV. Repeat due after 3 yrs per [facility]. 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 Works at [employer].  Hx of STDs: HPV    Last mammogram: 12/03/22 Results were: normal--routine follow-up in 12 months.  There is a FH of breast cancer in her pat aunt, ovarian cancer in a different pat aunt, and prostate cancer in her dad requiring radiation and lupron tx (diagnosed last yr). MyRisk neg 2021, IBIS=9.5%/riskscore=13.8%. There is no FH of ovarian cancer. The patient does self-breast exams.   Tobacco use: The patient denies current or previous tobacco use. Alcohol use: none No drug use Exercise: mod active  Colonoscopy: 8/24; repeat due after 10 yrs.    She does not get adequate calcium but does get Vitamin D in her diet.   Labs with PCP   Past Medical History:  Diagnosis Date   Abnormal Pap smear of cervix    BRCA negative 12/2019   MyRisk neg   Chest pain    Depression    Dysplasia of cervix, high grade CIN 2 2016   Family history of breast cancer 12/2019   IBIS=9.5%/riskscore=13.8%   Family history of ovarian cancer    GERD (gastroesophageal reflux disease)    Interstitial cystitis    Seasonal allergies    Skin cancer    She has a history of skin cancer vs dysplastic nevi removed at back from > 10 years ago.    Past Surgical History:  Procedure Laterality Date   ABDOMINAL HYSTERECTOMY  2011   [facility]   CARPAL TUNNEL RELEASE  2009   CHOLECYSTECTOMY     CYSTOSCOPY      DILATION AND CURETTAGE OF UTERUS  2009   [facility]   LEEP  2016   [facility]    Family History  Problem Relation Age of Onset   Diabetes Mother    Heart disease Father    Hypertension Father    Prostate cancer Father 52       with radiation and hormone tx; no mets   Cervical cancer Maternal Aunt    Colon cancer Maternal Aunt 52   Breast cancer Paternal Aunt 62   Ovarian cancer Paternal Aunt 46   Colon cancer Maternal Grandmother 40   Diabetes Maternal Grandmother    Diabetes Paternal Grandmother    Diabetes Paternal Grandfather     Social History   Socioeconomic History   Marital status: Married    Spouse name: Not on file   Number of children: Not on file   Years of education: Not on file   Highest education level: Not on file  Occupational History   Not on file  Tobacco Use   Smoking status: Never   Smokeless tobacco: Never  Vaping Use   Vaping status: Never Used  Substance and Sexual Activity   Alcohol use: No   Drug use: No   Sexual activity: Yes    Birth control/protection:  Surgical    Comment: Hysterectomy  Other Topics Concern   Not on file  Social History Narrative   Not on file   Social Determinants of Health   Financial Resource Strain: Low Risk  (11/09/2022)   [facility] from [facility], [facility]   Overall Financial Resource Strain (CARDIA)    Difficulty of Paying Living Expenses: Not hard at all  Food Insecurity: No Food Insecurity (11/09/2022)   Received from [facility], [facility]   Hunger Vital Sign    Worried About Running Out of Food in the Last Year: Never true    Ran Out of Food in the Last Year: Never true  Transportation Needs: No Transportation Needs (11/09/2022)   Received from [facility], [facility]   [facility] - Transportation    In the past 12 months, has lack of transportation kept you from medical appointments or from  getting medications?: No    Lack of Transportation (Non-Medical): No  Physical Activity: Not on file  Stress: Not on file  Social Connections: Not on file  Intimate Partner Violence: Not on file    Current Outpatient Medications on File Prior to Visit  Medication Sig Dispense Refill   ALPRAZolam (XANAX) 0.25 MG tablet Take by mouth.     Cholecalciferol (VITAMIN D3) 2000 UNITS capsule Take 2,000 Units by mouth daily.     fluticasone (FLONASE) 50 MCG/ACT nasal spray Place into the nose.     KLOR-CON M20 20 MEQ tablet Take 1 tablet by mouth daily.     montelukast (SINGULAIR) 10 MG tablet Take 1 tablet by mouth daily.     pantoprazole (PROTONIX) 40 MG tablet Take 40 mg by mouth daily.     sertraline (ZOLOFT) 100 MG tablet Take 1 tablet by mouth daily.     triamterene-hydrochlorothiazide (MAXZIDE-25) 37.5-25 MG per tablet Take 1 tablet by mouth daily.     No current facility-administered medications on file prior to visit.      ROS:  Review of Systems  Constitutional:  Negative for fatigue, fever and unexpected weight change.  Respiratory:  Negative for cough, shortness of breath and wheezing.   Cardiovascular:  Negative for chest pain, palpitations and leg swelling.  Gastrointestinal:  Negative for blood in stool, constipation, diarrhea, nausea and vomiting.  Endocrine: Negative for cold intolerance, heat intolerance and polyuria.  Genitourinary:  Negative for dyspareunia, dysuria, flank pain, frequency, genital sores, hematuria, menstrual problem, pelvic pain, urgency, vaginal bleeding, vaginal discharge and vaginal pain.  Musculoskeletal:  Negative for back pain, joint swelling and myalgias.  Skin:  Negative for rash.  Neurological:  Negative for dizziness, syncope, light-headedness, numbness and headaches.  Hematological:  Negative for adenopathy.  Psychiatric/Behavioral:  Negative for agitation, confusion, sleep disturbance and suicidal ideas. The patient is not nervous/anxious.       Objective: BP 126/80   Ht 5\' 7"  (1.702 m)   Wt 181 lb (82.1 kg)   BMI 28.35 kg/m    Physical Exam Constitutional:      Appearance: She is well-developed.  Genitourinary:     Vulva normal.     Genitourinary Comments: UTERUS SURG REM     Right Labia: No rash, tenderness or lesions.    Left Labia: No tenderness, lesions or rash.    Vaginal cuff intact.    No vaginal discharge, erythema or tenderness.      Right Adnexa: not tender and no mass present.  Left Adnexa: not tender and no mass present.    Cervix is not absent.     No cervical friability or polyp.     Uterus is not enlarged or tender.     Uterus is absent.  Breasts:    Right: No mass, nipple discharge, skin change or tenderness.     Left: No mass, nipple discharge, skin change or tenderness.  Neck:     Thyroid: No thyromegaly.  Cardiovascular:     Rate and Rhythm: Normal rate and regular rhythm.     Heart sounds: Normal heart sounds. No murmur heard. Pulmonary:     Effort: Pulmonary effort is normal.     Breath sounds: Normal breath sounds.  Abdominal:     Palpations: Abdomen is soft.     Tenderness: There is no abdominal tenderness. There is no guarding or rebound.  Musculoskeletal:        General: Normal range of motion.     Cervical back: Normal range of motion.  Lymphadenopathy:     Cervical: No cervical adenopathy.  Neurological:     General: No focal deficit present.     Mental Status: She is alert and oriented to person, place, and time.     Cranial Nerves: No cranial nerve deficit.  Skin:    General: Skin is warm and dry.  Psychiatric:        Mood and Affect: Mood normal.        Behavior: Behavior normal.        Thought Content: Thought content normal.        Judgment: Judgment normal.  Vitals reviewed.     Assessment/Plan: Encounter for annual routine gynecological examination  Cervical cancer screening - Plan: IGP, Aptima HPV  Screening for HPV (human papillomavirus) - Plan:  IGP, Aptima HPV  Dysplasia of cervix, high grade CIN 2 - Plan: IGP, Aptima HPV; repeat pap today  Encounter for screening mammogram for malignant neoplasm of breast; pt current on mammo   GYN counsel breast self exam, mammography screening, adequate intake of calcium and vitamin D, diet and exercise     F/U  Return in about 1 year (around 01/08/2024).  Dyshaun Bonzo B. Lorry Anastasi, PA-C 01/08/2023 12:35 PM

## 2023-01-08 ENCOUNTER — Ambulatory Visit (INDEPENDENT_AMBULATORY_CARE_PROVIDER_SITE_OTHER): Payer: Managed Care, Other (non HMO) | Admitting: Obstetrics and Gynecology

## 2023-01-08 ENCOUNTER — Encounter: Payer: Self-pay | Admitting: Obstetrics and Gynecology

## 2023-01-08 VITALS — BP 126/80 | Ht 67.0 in | Wt 181.0 lb

## 2023-01-08 DIAGNOSIS — Z124 Encounter for screening for malignant neoplasm of cervix: Secondary | ICD-10-CM

## 2023-01-08 DIAGNOSIS — N871 Moderate cervical dysplasia: Secondary | ICD-10-CM

## 2023-01-08 DIAGNOSIS — Z1211 Encounter for screening for malignant neoplasm of colon: Secondary | ICD-10-CM

## 2023-01-08 DIAGNOSIS — Z1231 Encounter for screening mammogram for malignant neoplasm of breast: Secondary | ICD-10-CM

## 2023-01-08 DIAGNOSIS — Z01419 Encounter for gynecological examination (general) (routine) without abnormal findings: Secondary | ICD-10-CM | POA: Diagnosis not present

## 2023-01-08 DIAGNOSIS — Z1151 Encounter for screening for human papillomavirus (HPV): Secondary | ICD-10-CM

## 2023-01-08 NOTE — Patient Instructions (Signed)
I value your feedback and you entrusting us with your care. If you get a [facility] patient survey, I would appreciate you taking the time to let us know about your experience today. Thank you! ? ? ?

## 2023-01-18 LAB — IGP, APTIMA HPV: HPV Aptima: NEGATIVE

## 2023-09-01 ENCOUNTER — Encounter: Payer: Self-pay | Admitting: Podiatry

## 2023-09-01 ENCOUNTER — Other Ambulatory Visit: Payer: Self-pay | Admitting: Podiatry

## 2023-09-01 DIAGNOSIS — M7662 Achilles tendinitis, left leg: Secondary | ICD-10-CM

## 2023-09-01 DIAGNOSIS — M722 Plantar fascial fibromatosis: Secondary | ICD-10-CM

## 2023-09-04 ENCOUNTER — Ambulatory Visit
Admission: RE | Admit: 2023-09-04 | Discharge: 2023-09-04 | Disposition: A | Discharge status: Home / Self Care | Source: Ambulatory Visit | Attending: Podiatry | Admitting: Podiatry

## 2023-09-04 DIAGNOSIS — M7662 Achilles tendinitis, left leg: Secondary | ICD-10-CM

## 2023-09-04 DIAGNOSIS — M722 Plantar fascial fibromatosis: Secondary | ICD-10-CM

## 2023-10-08 ENCOUNTER — Other Ambulatory Visit: Payer: Self-pay | Admitting: Podiatry

## 2023-10-20 ENCOUNTER — Other Ambulatory Visit: Payer: Self-pay | Admitting: Family Medicine

## 2023-10-20 DIAGNOSIS — Z1231 Encounter for screening mammogram for malignant neoplasm of breast: Secondary | ICD-10-CM

## 2023-10-21 ENCOUNTER — Encounter: Payer: Self-pay | Admitting: Podiatry

## 2023-10-22 ENCOUNTER — Encounter: Payer: Self-pay | Admitting: Podiatry

## 2023-10-22 NOTE — Anesthesia Preprocedure Evaluation (Addendum)
 Anesthesia Evaluation  Patient identified by MRN, date of birth, ID band Patient awake    Reviewed: Allergy & Precautions, NPO status , Patient's Chart, lab work & pertinent test results  History of Anesthesia Complications (+) PONV and history of anesthetic complications  Airway Mallampati: II  TM Distance: >3 FB Neck ROM: full    Dental no notable dental hx.    Pulmonary neg pulmonary ROS   Pulmonary exam normal        Cardiovascular negative cardio ROS Normal cardiovascular exam     Neuro/Psych  PSYCHIATRIC DISORDERS  Depression    negative neurological ROS     GI/Hepatic Neg liver ROS,GERD  Medicated,,  Endo/Other  negative endocrine ROS    Renal/GU negative Renal ROS  negative genitourinary   Musculoskeletal   Abdominal   Peds  Hematology negative hematology ROS (+)   Anesthesia Other Findings Past Medical History: No date: Abnormal Pap smear of cervix 12/2019: BRCA negative     Comment:  MyRisk neg No date: Car sickness No date: Chest pain No date: Depression 2016: Dysplasia of cervix, high grade CIN 2 12/2019: Family history of breast cancer     Comment:  IBIS=9.5%/riskscore=13.8% No date: Family history of ovarian cancer No date: GERD (gastroesophageal reflux disease) No date: Interstitial cystitis No date: Meniere's disease No date: PONV (postoperative nausea and vomiting) No date: PSVT (paroxysmal supraventricular tachycardia) (HCC) No date: Seasonal allergies No date: Skin cancer     Comment:  She has a history of skin cancer vs dysplastic nevi               removed at back from > 10 years ago.  Past Surgical History: 2011: ABDOMINAL HYSTERECTOMY     Comment:  [facility] 2009: CARPAL TUNNEL RELEASE No date: CHOLECYSTECTOMY No date: CYSTOSCOPY 2009: DILATION AND CURETTAGE OF UTERUS     Comment:  [facility] 2016: LEEP     Comment:  Margins Free  BMI    Body Mass Index: 28.19 kg/m       Reproductive/Obstetrics negative OB ROS                             Anesthesia Physical Anesthesia Plan  ASA: 2  Anesthesia Plan: General LMA   Post-op Pain Management: Regional block   Induction: Intravenous  PONV Risk Score and Plan: Dexamethasone, Ondansetron , Midazolam, Treatment may vary due to age or medical condition, Propofol infusion and TIVA  Airway Management Planned: Natural Airway and Nasal Cannula  Additional Equipment:   Intra-op Plan:   Post-operative Plan:   Informed Consent: I have reviewed the patients History and Physical, chart, labs and discussed the procedure including the risks, benefits and alternatives for the proposed anesthesia with the patient or authorized representative who has indicated his/her understanding and acceptance.     Dental Advisory Given  Plan Discussed with: Anesthesiologist, CRNA and Surgeon  Anesthesia Plan Comments: (Patient consented for risks of anesthesia including but not limited to:  - adverse reactions to medications - risk of airway placement if required - damage to eyes, teeth, lips or other oral mucosa - nerve damage due to positioning  - sore throat or hoarseness - Damage to heart, brain, nerves, lungs, other parts of body or loss of life  Patient voiced understanding and assent.)       Anesthesia Quick Evaluation

## 2023-11-01 NOTE — Discharge Instructions (Signed)
 [facility]  POST OPERATIVE INSTRUCTIONS FOR DR. ASHLEY AND DR. BAKER [facility] PODIATRY DEPARTMENT   Take your medication as prescribed.  Pain medication should be taken only as needed.  Keep the dressing clean, dry and intact.  Keep your foot elevated above the heart level for the first 48 hours.  Walking to the bathroom and brief periods of walking are acceptable, unless we have instructed you to be non-weight bearing.  Always wear your post-op shoe when walking.  Always use your crutches if you are to be non-weight bearing.  Do not take a shower. Baths are permissible as long as the foot is kept out of the water.   Every hour you are awake:  Bend your knee 15 times. Flex foot 15 times Massage calf 15 times  Call [facility] [phone]) if any of the following problems occur: You develop a temperature or fever. The bandage becomes saturated with blood. Medication does not stop your pain. Injury of the foot occurs. Any symptoms of infection including redness, odor, or red streaks running from wound.

## 2023-11-03 ENCOUNTER — Other Ambulatory Visit: Payer: Self-pay

## 2023-11-03 ENCOUNTER — Encounter: Payer: Self-pay | Admitting: Podiatry

## 2023-11-03 ENCOUNTER — Ambulatory Visit: Payer: Self-pay | Admitting: Anesthesiology

## 2023-11-03 ENCOUNTER — Encounter
Admission: RE | Disposition: A | Discharge status: Home / Self Care | Payer: Self-pay | Source: Home / Self Care | Attending: Podiatry

## 2023-11-03 ENCOUNTER — Ambulatory Visit
Admission: RE | Admit: 2023-11-03 | Discharge: 2023-11-03 | Disposition: A | Discharge status: Home / Self Care | Attending: Podiatry | Admitting: Podiatry

## 2023-11-03 DIAGNOSIS — Z79899 Other long term (current) drug therapy: Secondary | ICD-10-CM | POA: Diagnosis not present

## 2023-11-03 DIAGNOSIS — M722 Plantar fascial fibromatosis: Secondary | ICD-10-CM | POA: Diagnosis present

## 2023-11-03 DIAGNOSIS — G5752 Tarsal tunnel syndrome, left lower limb: Secondary | ICD-10-CM | POA: Diagnosis present

## 2023-11-03 DIAGNOSIS — K219 Gastro-esophageal reflux disease without esophagitis: Secondary | ICD-10-CM | POA: Diagnosis not present

## 2023-11-03 HISTORY — PX: PLANTAR FASCIA RELEASE: SHX2239

## 2023-11-03 HISTORY — DX: Motion sickness, initial encounter: T75.3XXA

## 2023-11-03 HISTORY — DX: Meniere's disease, unspecified ear: H81.09

## 2023-11-03 HISTORY — DX: Supraventricular tachycardia, unspecified: I47.10

## 2023-11-03 HISTORY — DX: Other specified postprocedural states: R11.2

## 2023-11-03 SURGERY — RELEASE, FASCIA, PLANTAR
Anesthesia: General | Site: Foot | Laterality: Left

## 2023-11-03 MED ORDER — SODIUM CHLORIDE 0.9 % IV SOLN: INTRAVENOUS | Status: DC

## 2023-11-03 MED ORDER — MIDAZOLAM HCL 2 MG/2ML IJ SOLN
INTRAMUSCULAR | Status: AC
Start: 2023-11-03 — End: 2023-11-03
  Filled 2023-11-03: qty 2

## 2023-11-03 MED ORDER — ONDANSETRON HCL 4 MG/2ML IJ SOLN
INTRAMUSCULAR | Status: DC | PRN
  Administered 2023-11-03: 4 mg via INTRAVENOUS

## 2023-11-03 MED ORDER — PROPOFOL 500 MG/50ML IV EMUL
INTRAVENOUS | Status: DC | PRN
  Administered 2023-11-03: 150 ug/kg/min via INTRAVENOUS

## 2023-11-03 MED ORDER — HYDROCODONE-ACETAMINOPHEN 5-325 MG PO TABS: 1.0000 | ORAL_TABLET | Freq: Four times a day (QID) | ORAL | 0 refills | Status: AC | PRN

## 2023-11-03 MED ORDER — CEFAZOLIN SODIUM-DEXTROSE 2-3 GM-%(50ML) IV SOLR
INTRAVENOUS | Status: AC
  Filled 2023-11-03: qty 50

## 2023-11-03 MED ORDER — FENTANYL CITRATE (PF) 100 MCG/2ML IJ SOLN
INTRAMUSCULAR | Status: DC | PRN
  Administered 2023-11-03 (×2): 50 ug via INTRAVENOUS

## 2023-11-03 MED ORDER — BUPIVACAINE LIPOSOME 1.3 % IJ SUSP
INTRAMUSCULAR | Status: AC
Start: 2023-11-03 — End: 2023-11-03
  Filled 2023-11-03: qty 10

## 2023-11-03 MED ORDER — ONDANSETRON 4 MG PO TBDP: 4.0000 mg | ORAL_TABLET | Freq: Three times a day (TID) | ORAL | 0 refills | Status: AC | PRN

## 2023-11-03 MED ORDER — BUPIVACAINE-EPINEPHRINE (PF) 0.25% -1:200000 IJ SOLN
INTRAMUSCULAR | Status: DC | PRN
  Administered 2023-11-03: 10 mL

## 2023-11-03 MED ORDER — PROPOFOL 10 MG/ML IV BOLUS
INTRAVENOUS | Status: DC | PRN
  Administered 2023-11-03 (×2): 100 mg via INTRAVENOUS

## 2023-11-03 MED ORDER — SCOPOLAMINE 1 MG/3DAYS TD PT72
1.0000 | MEDICATED_PATCH | TRANSDERMAL | Status: DC
  Administered 2023-11-03: 1.5 mg via TRANSDERMAL

## 2023-11-03 MED ORDER — FENTANYL CITRATE (PF) 100 MCG/2ML IJ SOLN
INTRAMUSCULAR | Status: AC
  Filled 2023-11-03: qty 2

## 2023-11-03 MED ORDER — PROPOFOL 1000 MG/100ML IV EMUL
INTRAVENOUS | Status: AC
Start: 2023-11-03 — End: 2023-11-03
  Filled 2023-11-03: qty 100

## 2023-11-03 MED ORDER — CEFAZOLIN SODIUM-DEXTROSE 2-4 GM/100ML-% IV SOLN
2.0000 g | INTRAVENOUS | Status: AC
  Administered 2023-11-03: 2 g via INTRAVENOUS

## 2023-11-03 MED ORDER — ASPIRIN 81 MG PO TBEC: 81.0000 mg | DELAYED_RELEASE_TABLET | Freq: Two times a day (BID) | ORAL | 0 refills | Status: AC

## 2023-11-03 MED ORDER — SUCCINYLCHOLINE CHLORIDE 200 MG/10ML IV SOSY
PREFILLED_SYRINGE | INTRAVENOUS | Status: DC | PRN
  Administered 2023-11-03: 80 mg via INTRAVENOUS

## 2023-11-03 MED ORDER — SCOPOLAMINE 1 MG/3DAYS TD PT72
MEDICATED_PATCH | TRANSDERMAL | Status: AC
  Filled 2023-11-03: qty 1

## 2023-11-03 MED ORDER — MIDAZOLAM HCL 2 MG/2ML IJ SOLN
INTRAMUSCULAR | Status: AC
  Filled 2023-11-03: qty 2

## 2023-11-03 MED ORDER — DEXAMETHASONE SODIUM PHOSPHATE 10 MG/ML IJ SOLN
INTRAMUSCULAR | Status: DC | PRN
  Administered 2023-11-03: 4 mg via INTRAVENOUS

## 2023-11-03 MED ORDER — LIDOCAINE HCL (CARDIAC) PF 100 MG/5ML IV SOSY
PREFILLED_SYRINGE | INTRAVENOUS | Status: DC | PRN
  Administered 2023-11-03: 80 mg via INTRAVENOUS

## 2023-11-03 MED ORDER — BUPIVACAINE LIPOSOME 1.3 % IJ SUSP
INTRAMUSCULAR | Status: DC | PRN
  Administered 2023-11-03: 10 mL

## 2023-11-03 MED ORDER — MIDAZOLAM HCL 5 MG/5ML IJ SOLN
INTRAMUSCULAR | Status: DC | PRN
  Administered 2023-11-03 (×4): 1 mg via INTRAVENOUS

## 2023-11-03 SURGICAL SUPPLY — 31 items
BNDG COHESIVE 4X5 TAN STRL LF (GAUZE/BANDAGES/DRESSINGS) ×1 IMPLANT
BNDG ESMARCH 4X12 STRL LF (GAUZE/BANDAGES/DRESSINGS) ×1 IMPLANT
BNDG GAUZE DERMACEA FLUFF 4 (GAUZE/BANDAGES/DRESSINGS) ×1 IMPLANT
BNDG STRETCH 4X75 STRL LF (GAUZE/BANDAGES/DRESSINGS) ×1 IMPLANT
BOOT STEPPER DURA MED (SOFTGOODS) IMPLANT
CANISTER SUCT 1200ML W/VALVE (MISCELLANEOUS) ×1 IMPLANT
COVER LIGHT HANDLE UNIVERSAL (MISCELLANEOUS) ×2 IMPLANT
DURAPREP 26ML APPLICATOR (WOUND CARE) ×1 IMPLANT
ELECTRODE REM PT RTRN 9FT ADLT (ELECTROSURGICAL) ×1 IMPLANT
GAUZE SPONGE 4X4 12PLY STRL (GAUZE/BANDAGES/DRESSINGS) IMPLANT
GAUZE XEROFORM 1X8 LF (GAUZE/BANDAGES/DRESSINGS) ×1 IMPLANT
GAUZE XEROFORM 5X9 LF (GAUZE/BANDAGES/DRESSINGS) ×1 IMPLANT
GLOVE SURG SS PI 7.5 STRL IVOR (GLOVE) ×1 IMPLANT
GLOVE SURG UNDER LTX SZ8 (GLOVE) ×1 IMPLANT
GOWN STRL REUS W/ TWL LRG LVL3 (GOWN DISPOSABLE) ×2 IMPLANT
IV NS 250ML BAXH (IV SOLUTION) ×1 IMPLANT
KIT PRC PRB RTRGD 3ANG KNF HND (MISCELLANEOUS) ×1 IMPLANT
KIT TURNOVER KIT A (KITS) ×1 IMPLANT
KWIRE DBL END TROCAR 6X.062 (WIRE) IMPLANT
NDL 18GX1X1/2 (RX/OR ONLY) (NEEDLE) IMPLANT
NDL HYPO 25GX1X1/2 BEV (NEEDLE) IMPLANT
NEEDLE 18GX1X1/2 (RX/OR ONLY) (NEEDLE) ×2 IMPLANT
NEEDLE HYPO 25GX1X1/2 BEV (NEEDLE) ×2 IMPLANT
NS IRRIG 500ML POUR BTL (IV SOLUTION) ×1 IMPLANT
PACK EXTREMITY ARMC (MISCELLANEOUS) ×1 IMPLANT
PAD ABD DERMACEA PRESS 5X9 (GAUZE/BANDAGES/DRESSINGS) IMPLANT
STOCKINETTE IMPERVIOUS LG (DRAPES) ×1 IMPLANT
SUT ETHILON 3-0 (SUTURE) IMPLANT
SUT VIC AB 3-0 SH 27X BRD (SUTURE) IMPLANT
SYR 10ML LL (SYRINGE) IMPLANT
WAND TOPAZ MICRO DEBRIDER (MISCELLANEOUS) IMPLANT

## 2023-11-03 NOTE — H&P (Signed)
 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  The various methods of treatment have been discussed with the patient.  No guarantees were given.  After consideration of risks, benefits and other options for treatment, the patient has consented to surgery.  I have reviewed the patients' chart and labs.     A history and physical examination was performed in my office.  The patient was reexamined.  There have been no changes to this history and physical examination.  Ashley Soulier A

## 2023-11-03 NOTE — Anesthesia Postprocedure Evaluation (Signed)
 Anesthesia Post Note  Patient: Ann Rhodes  Procedure(s) Performed: RELEASE, FASCIA, PLANTAR (Left: Foot)  Patient location during evaluation: PACU Anesthesia Type: General Level of consciousness: awake and alert Pain management: pain level controlled Vital Signs Assessment: post-procedure vital signs reviewed and stable Respiratory status: spontaneous breathing, nonlabored ventilation, respiratory function stable and patient connected to nasal cannula oxygen Cardiovascular status: blood pressure returned to baseline and stable Postop Assessment: no apparent nausea or vomiting Anesthetic complications: no   No notable events documented.   Last Vitals:  Vitals:   11/03/23 1535 11/03/23 1545  BP: 116/79 115/81  Pulse: 88 87  Resp: 14 17  Temp:    SpO2: 100% 100%    Last Pain:  Vitals:   11/03/23 1545  TempSrc:   PainSc: 0-No pain                 Ann Rhodes

## 2023-11-03 NOTE — Anesthesia Procedure Notes (Signed)
 Procedure Name: LMA Insertion Date/Time: 11/03/2023 2:40 PM  Performed by: Jarvis Lew, CRNAPre-anesthesia Checklist: Patient identified, Patient being monitored, Timeout performed, Emergency Drugs available and Suction available Patient Re-evaluated:Patient Re-evaluated prior to induction Oxygen Delivery Method: Circle system utilized Preoxygenation: Pre-oxygenation with 100% oxygen Induction Type: IV induction Ventilation: Mask ventilation without difficulty LMA: LMA inserted LMA Size: 4.0 Tube type: Oral Number of attempts: 1 Placement Confirmation: positive ETCO2 and breath sounds checked- equal and bilateral Tube secured with: Tape Dental Injury: Teeth and Oropharynx as per pre-operative assessment  Comments: Eyes taped closed after LOC.  Pt has small mouth opening and high med tolerance.  Sux required as noted to place LMA.  Short chin and TMD.    LMA 4 seated easily with good = BBS and ETC02 confirmed.

## 2023-11-03 NOTE — Transfer of Care (Signed)
 Immediate Anesthesia Transfer of Care Note  Patient: Ann Rhodes  Procedure(s) Performed: RELEASE, FASCIA, PLANTAR (Left: Foot)  Patient Location: PACU  Anesthesia Type:General  Level of Consciousness: awake  Airway & Oxygen Therapy: Patient Spontanous Breathing  Post-op Assessment: Report given to RN and Post -op Vital signs reviewed and stable  Post vital signs: Reviewed and stable  Last Vitals:  Vitals Value Taken Time  BP 116/79 11/03/23 15:33  Temp 96.8 06/25/251536   Pulse 87 11/03/23 15:35  Resp 14 11/03/23 15:35  SpO2 100 % 11/03/23 15:35  Vitals shown include unfiled device data.  Last Pain:  Vitals:   11/03/23 1253  TempSrc: Temporal  PainSc: 0-No pain         Complications: No notable events documented.

## 2023-11-03 NOTE — Op Note (Signed)
 Operative note   Surgeon:Lateka Rady Armed forces logistics/support/administrative officer: None    Preop diagnosis: 1.  Plantar fasciitis left heel 2.  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  Endoscopic plantar fasciotomy left foot 2.  Distal tarsal tunnel release with Baxters nerve release left medial heel 3.  Percutaneous Topaz wand infiltrated along the plantar fascial insertion    EBL: Minimal    Anesthesia:local and general.  Local consist of a total of 20 cc of a one-to-one mixture of 0.25% plain bupivacaine and Exparel long-acting anesthetic.    Hemostasis: Mid calf tourniquet inflated to 200 mmHg for 33 minutes    Specimen: None    Complications: None    Operative indications:Holleigh E Raven is an 48 y.o. that presents today for surgical intervention.  The risks/benefits/alternatives/complications have been discussed and consent has been given.    Procedure:  Patient was brought into the OR and placed on the operating table in thesupine position. After anesthesia was obtained theleft lower extremity was prepped and draped in usual sterile fashion.  Attention was initially directed to the left medial aspect of the foot where a small incision was made at approximately 2 cm from plantar and 5 cm from posterior calcaneus.  Sharp and blunt dissection carried down to the fascia.  The fascial elevator was introduced.  The blunt trocar and cannula was introduced and a percutaneous incision was made laterally.  The trocar cannula was then delivered laterally.  Initial evaluation showed the trocar and cannula were just deep to the plantar fascia.  This was then removed and repositioned to superficial to the plantar fascia.  A second stab incision was made on the lateral foot.  Endoscopy then revealed good positioning of the cannula.  Next with a small diamond blade the medial one half of the plantar fascia was then released.  The muscle belly was noted at this time.  The wound  was flushed.  The trocar and cannula was then removed from the surgical field.  Tension was directed to the medial aspect of the heel where the medial incision was then further lengthened along the distal tarsal tunnel region.  The superficial fascia was noted and transected.  The muscle belly at this time was noted on the medial aspect of the heel.  It was reflected both proximal distal revealing the deep fascial layer.  This was then incised.  This exposed the deep adipose tissue along the Baxters nerve region.  This was freed appropriately into the fascial region.  The wound was flushed with copious amounts of irrigation.  Blunt probing was noted and no further deep fascia attention was found at this time.  The superficial subcutaneous tissue was reapproximated with a 3-0 Vicryl and the skin reapproximated with 3-0 nylon.  Finally in a small grid like pattern small percutaneous stabs were made in the plantar aspect of the heel.  The Topaz wand was then introduced into the fascia in the gridlike fashion and infiltrated along the fascial region.  A bulky sterile dressing was applied to the plantar aspect of the left foot.  She was placed in neutral 90 degree position and was a walker boot for nonweightbearing.    Patient tolerated the procedure and anesthesia well.  Was transported from the OR to the PACU with all vital signs stable and vascular status intact. To be discharged per routine protocol.  Will follow up in approximately 1 week in the  outpatient clinic.

## 2023-11-04 ENCOUNTER — Encounter: Payer: Self-pay | Admitting: Podiatry

## 2023-12-01 ENCOUNTER — Other Ambulatory Visit: Payer: Self-pay | Admitting: Medical Genetics

## 2023-12-08 ENCOUNTER — Ambulatory Visit
Admission: RE | Admit: 2023-12-08 | Discharge: 2023-12-08 | Disposition: A | Discharge status: Home / Self Care | Source: Ambulatory Visit | Attending: Family Medicine | Admitting: Family Medicine

## 2023-12-08 DIAGNOSIS — Z1231 Encounter for screening mammogram for malignant neoplasm of breast: Secondary | ICD-10-CM | POA: Diagnosis present

## 2023-12-30 ENCOUNTER — Encounter: Payer: Self-pay | Admitting: Dermatology

## 2023-12-30 ENCOUNTER — Ambulatory Visit (INDEPENDENT_AMBULATORY_CARE_PROVIDER_SITE_OTHER): Payer: Managed Care, Other (non HMO) | Admitting: Dermatology

## 2023-12-30 DIAGNOSIS — C4490 Unspecified malignant neoplasm of skin, unspecified: Secondary | ICD-10-CM | POA: Insufficient documentation

## 2023-12-30 DIAGNOSIS — C439 Malignant melanoma of skin, unspecified: Secondary | ICD-10-CM

## 2023-12-30 DIAGNOSIS — L578 Other skin changes due to chronic exposure to nonionizing radiation: Secondary | ICD-10-CM | POA: Diagnosis not present

## 2023-12-30 DIAGNOSIS — N3010 Interstitial cystitis (chronic) without hematuria: Secondary | ICD-10-CM | POA: Insufficient documentation

## 2023-12-30 DIAGNOSIS — L817 Pigmented purpuric dermatosis: Secondary | ICD-10-CM

## 2023-12-30 DIAGNOSIS — D485 Neoplasm of uncertain behavior of skin: Secondary | ICD-10-CM

## 2023-12-30 DIAGNOSIS — D492 Neoplasm of unspecified behavior of bone, soft tissue, and skin: Secondary | ICD-10-CM

## 2023-12-30 DIAGNOSIS — W908XXA Exposure to other nonionizing radiation, initial encounter: Secondary | ICD-10-CM

## 2023-12-30 DIAGNOSIS — D2272 Melanocytic nevi of left lower limb, including hip: Secondary | ICD-10-CM

## 2023-12-30 DIAGNOSIS — N809 Endometriosis, unspecified: Secondary | ICD-10-CM | POA: Insufficient documentation

## 2023-12-30 DIAGNOSIS — D229 Melanocytic nevi, unspecified: Secondary | ICD-10-CM

## 2023-12-30 DIAGNOSIS — Z1283 Encounter for screening for malignant neoplasm of skin: Secondary | ICD-10-CM

## 2023-12-30 DIAGNOSIS — L814 Other melanin hyperpigmentation: Secondary | ICD-10-CM | POA: Diagnosis not present

## 2023-12-30 DIAGNOSIS — D239 Other benign neoplasm of skin, unspecified: Secondary | ICD-10-CM

## 2023-12-30 DIAGNOSIS — D2371 Other benign neoplasm of skin of right lower limb, including hip: Secondary | ICD-10-CM

## 2023-12-30 DIAGNOSIS — D225 Melanocytic nevi of trunk: Secondary | ICD-10-CM

## 2023-12-30 DIAGNOSIS — D1801 Hemangioma of skin and subcutaneous tissue: Secondary | ICD-10-CM

## 2023-12-30 DIAGNOSIS — C4359 Malignant melanoma of other part of trunk: Secondary | ICD-10-CM | POA: Diagnosis not present

## 2023-12-30 DIAGNOSIS — L821 Other seborrheic keratosis: Secondary | ICD-10-CM

## 2023-12-30 DIAGNOSIS — L905 Scar conditions and fibrosis of skin: Secondary | ICD-10-CM

## 2023-12-30 DIAGNOSIS — R42 Dizziness and giddiness: Secondary | ICD-10-CM | POA: Insufficient documentation

## 2023-12-30 HISTORY — DX: Malignant melanoma of skin, unspecified: C43.9

## 2023-12-30 NOTE — Patient Instructions (Addendum)

## 2023-12-30 NOTE — Progress Notes (Signed)
 Follow-Up Visit   Subjective  Ann Rhodes is a 48 y.o. female who presents for the following: Skin Cancer Screening and Full Body Skin Exam. She has a history of skin cancer vs dysplastic nevus removed at back from > 11 years ago. Unable to obtain records or pathology reports. The spots were biopsied and then excised. She denies frequent skin exams multiple times a year after this so melanoma unlikely.   Spot of concern inside left nostril. Dur: few months. Tender at times. Denies bleeding.   Surgery in June on left foot. Wound site infected with staph aureus. Tx with Doxycycline 100 mg twice a day for 5 days.  The patient presents for Total-Body Skin Exam (TBSE) for skin cancer screening and mole check. The patient has spots, moles and lesions to be evaluated, some may be new or changing and the patient may have concern these could be cancer.    The following portions of the chart were reviewed this encounter and updated as appropriate: medications, allergies, medical history  Review of Systems:  No other skin or systemic complaints except as noted in HPI or Assessment and Plan.  Objective  Well appearing patient in no apparent distress; mood and affect are within normal limits.  A full examination was performed including scalp, head, eyes, ears, nose, lips, neck, chest, axillae, abdomen, back, buttocks, bilateral upper extremities, bilateral lower extremities, hands, feet, fingers, toes, fingernails, and toenails. All findings within normal limits unless otherwise noted below.   Relevant physical exam findings are noted in the Assessment and Plan.  Exam of nails limited by presence of nail polish.  Left Lower Leg - Anterior Scattered red brown macules on lower legs Right Upper Back 11 mm Brown patch within surgical scar  Left Lower Back 5 mm irregular Pink to brown papule   Assessment & Plan   SKIN CANCER SCREENING PERFORMED TODAY.  History of Skin Cancer vs dysplastic  nevus at back >11 years ago. Hypertrophic Scar. - Clear. Observe for recurrence. Well-healed scars x 2 at upper back - no cervical, axillary or inguinal lymphadenopathy - Call clinic for new or changing lesions.   - Recommend regular skin exams, daily broad-spectrum spf 30+ sunscreen use, and photoprotection  ACTINIC DAMAGE - Chronic condition, secondary to cumulative UV/sun exposure - diffuse scaly erythematous macules with underlying dyspigmentation - Recommend daily broad spectrum sunscreen SPF 30+ to sun-exposed areas, reapply every 2 hours as needed.  - Staying in the shade or wearing long sleeves, sun glasses (UVA+UVB protection) and wide brim hats (4-inch brim around the entire circumference of the hat) are also recommended for sun protection.  - Call for new or changing lesions.  LENTIGINES, SEBORRHEIC KERATOSES, HEMANGIOMAS - Benign normal skin lesions - Benign-appearing - Call for any changes  MELANOCYTIC NEVI - Tan-brown and/or pink-flesh-colored symmetric macules and papules - Benign appearing on exam today - Observation - Call clinic for new or changing moles - Recommend daily use of broad spectrum spf 30+ sunscreen to sun-exposed areas.  - Check nails when remove polish.   DERMATOFIBROMA Exam: Firm pink/brown papulenodule with dimple sign at right medial thigh. Treatment Plan: A dermatofibroma is a benign growth possibly related to trauma, such as an insect bite, cut from shaving, or inflamed acne-type bump.  Treatment options to remove include shave or excision with resulting scar and risk of recurrence.  Since benign-appearing and not bothersome, will observe for now.    SCAR secondary to surgery on left foot Exam: pink  linear lesion. Benign-appearing.  Observation.  Call clinic for new or changing lesions. Recommend daily broad spectrum sunscreen SPF 30+, reapply every 2 hours as needed. Treatment: No signs of infection today.  Recommend Serica moisturizing scar  formula cream every night or [facility] brand or Mederma silicone scar sheet every night for the first year after a scar appears to help with scar remodeling if desired. Scars remodel on their own for a full year and will gradually improve in appearance over time.  MELANOCYTIC NEVUS Exam: L plantar foot near heel, brown macule  Treatment Plan: Benign appearing on exam today. Recommend observation. Call clinic for new or changing moles. Recommend daily use of broad spectrum spf 30+ sunscreen to sun-exposed areas.    SCHAMBERG'S CAPILLARITIS Left Lower Leg - Anterior Compression socks daily NEOPLASM OF SKIN (2) Right Upper Back Epidermal / dermal shaving  Lesion diameter (cm):  1.1 Informed consent: discussed and consent obtained   Timeout: patient name, date of birth, surgical site, and procedure verified   Procedure prep:  Patient was prepped and draped in usual sterile fashion Prep type:  Isopropyl alcohol Anesthesia: the lesion was anesthetized in a standard fashion   Anesthetic:  1% lidocaine  w/ epinephrine  1-100,000 buffered w/ 8.4% NaHCO3 Instrument used: DermaBlade   Hemostasis achieved with: pressure and aluminum chloride   Outcome: patient tolerated procedure well   Post-procedure details: wound care instructions given    Left Lower Back Epidermal / dermal shaving  Lesion diameter (cm):  0.5 Informed consent: discussed and consent obtained   Timeout: patient name, date of birth, surgical site, and procedure verified   Procedure prep:  Patient was prepped and draped in usual sterile fashion Prep type:  Isopropyl alcohol Anesthesia: the lesion was anesthetized in a standard fashion   Anesthetic:  1% lidocaine  w/ epinephrine  1-100,000 buffered w/ 8.4% NaHCO3 Instrument used: DermaBlade   Hemostasis achieved with: pressure and aluminum chloride   Outcome: patient tolerated procedure well   Post-procedure details: wound care instructions given    Related  Procedures Anatomic Pathology Report MULTIPLE BENIGN NEVI   LENTIGINES   ACTINIC ELASTOSIS   SEBORRHEIC KERATOSES   CHERRY ANGIOMA   DERMATOFIBROMA    Return in about 1 year (around 12/29/2024) for TBSE.  I, Kate Fought, CMA, am acting as scribe for Boneta Sharps, MD.   Documentation: I have reviewed the above documentation for accuracy and completeness, and I agree with the above.  Boneta Sharps, MD

## 2024-01-17 ENCOUNTER — Encounter: Payer: Self-pay | Admitting: Dermatology

## 2024-01-18 ENCOUNTER — Ambulatory Visit: Payer: Self-pay | Admitting: Dermatology

## 2024-01-18 ENCOUNTER — Telehealth: Payer: Self-pay | Admitting: Dermatology

## 2024-01-18 LAB — ANATOMIC PATHOLOGY REPORT

## 2024-01-18 NOTE — Telephone Encounter (Signed)
 Shared biopsy result with patient. 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 Opening tomorrow morning. Patient agrees with excision. Wound from surgery will heal faster than biopsy wound. Site B benign nevus within scar. All questions answered

## 2024-01-19 ENCOUNTER — Ambulatory Visit (INDEPENDENT_AMBULATORY_CARE_PROVIDER_SITE_OTHER): Admitting: Dermatology

## 2024-01-19 DIAGNOSIS — C4359 Malignant melanoma of other part of trunk: Secondary | ICD-10-CM

## 2024-01-19 DIAGNOSIS — C439 Malignant melanoma of skin, unspecified: Secondary | ICD-10-CM

## 2024-01-19 MED ORDER — MUPIROCIN 2 % EX OINT: 1.0000 | TOPICAL_OINTMENT | Freq: Every day | CUTANEOUS | 0 refills | Status: AC

## 2024-01-19 NOTE — Patient Instructions (Signed)

## 2024-01-19 NOTE — Progress Notes (Unsigned)
   Follow-Up Visit   Subjective  Ann Rhodes is a 48 y.o. female who presents for the following: Excision of BX proven MM at left lower back  The following portions of the chart were reviewed this encounter and updated as appropriate: medications, allergies, medical history  Review of Systems:  No other skin or systemic complaints except as noted in HPI or Assessment and Plan.  Objective  Well appearing patient in no apparent distress; mood and affect are within normal limits.  A focused examination was performed of the following areas: back Relevant physical exam findings are noted in the Assessment and Plan.   Left Lower Back Pink bx site   Assessment & Plan   MELANOMA OF SKIN (HCC) Left Lower Back Skin excision  Excision method:  elliptical Total excision diameter (cm):  1.8 Informed consent: discussed and consent obtained   Timeout: patient name, date of birth, surgical site, and procedure verified   Procedure prep:  Patient was prepped and draped in usual sterile fashion Prep type:  Chlorhexidine Anesthesia: the lesion was anesthetized in a standard fashion   Anesthetic:  1% lidocaine  w/ epinephrine  1-100,000 buffered w/ 8.4% NaHCO3 (21 cc) Instrument used: #15 blade   Hemostasis achieved with: suture, pressure and electrodesiccation   Outcome: patient tolerated procedure well with no complications   Additional details:  Tagged   Skin repair Complexity:  Intermediate Final length (cm):  6.1 Informed consent: discussed and consent obtained   Timeout: patient name, date of birth, surgical site, and procedure verified   Procedure prep:  Patient was prepped and draped in usual sterile fashion Prep type:  Chlorhexidine Anesthesia: the lesion was anesthetized in a standard fashion   Anesthetic:  1% lidocaine  w/ epinephrine  1-100,000 buffered w/ 8.4% NaHCO3 Reason for type of repair: reduce tension to allow closure, reduce the risk of dehiscence, infection, and  necrosis, reduce subcutaneous dead space and avoid a hematoma, allow closure of the large defect and preserve normal anatomy   Undermining: edges could be approximated without difficulty   Subcutaneous layers (deep stitches):  Suture size:  3-0 Suture type: Monocryl (poliglecaprone 25)   Stitches:  Buried vertical mattress Fine/surface layer approximation (top stitches):  Suture size:  4-0 Suture type: Prolene (polypropylene)   Stitches: simple running   Suture removal (days):  7 Hemostasis achieved with: suture, pressure and electrodesiccation Outcome: patient tolerated procedure well with no complications   Post-procedure details: sterile dressing applied and wound care instructions given   Dressing type: petrolatum, bandage and pressure dressing    Related Procedures Anatomic Pathology Report   Return in about 1 week (around 01/26/2024) for Suture Removal, with Dr. Claudene.  LILLETTE Lonell Drones, RMA, am acting as scribe for Boneta Claudene, MD .   Documentation: I have reviewed the above documentation for accuracy and completeness, and I agree with the above.  Boneta Claudene, MD

## 2024-01-20 ENCOUNTER — Telehealth: Payer: Self-pay

## 2024-01-20 ENCOUNTER — Encounter: Payer: Self-pay | Admitting: Dermatology

## 2024-01-20 NOTE — Telephone Encounter (Signed)
 Patient doing well after surgery yesterday. Keep SR appt as scheduled. Lonell RAMAN., RMA

## 2024-01-27 ENCOUNTER — Encounter: Payer: Self-pay | Admitting: Dermatology

## 2024-01-27 ENCOUNTER — Ambulatory Visit: Admitting: Dermatology

## 2024-01-27 DIAGNOSIS — Z4802 Encounter for removal of sutures: Secondary | ICD-10-CM

## 2024-01-27 DIAGNOSIS — Z48817 Encounter for surgical aftercare following surgery on the skin and subcutaneous tissue: Secondary | ICD-10-CM

## 2024-01-27 DIAGNOSIS — Z5189 Encounter for other specified aftercare: Secondary | ICD-10-CM

## 2024-01-27 NOTE — Patient Instructions (Signed)

## 2024-01-27 NOTE — Progress Notes (Signed)
   Follow-Up Visit   Subjective  Ann Rhodes is a 48 y.o. female who presents for the following: Suture removal at left lower back.  Pathology results pending  The following portions of the chart were reviewed this encounter and updated as appropriate: medications, allergies, medical history  Review of Systems:  No other skin or systemic complaints except as noted in HPI or Assessment and Plan.  Objective  Well appearing patient in no apparent distress; mood and affect are within normal limits.  Areas Examined: Back  Relevant physical exam findings are noted in the Assessment and Plan.    Assessment & Plan   VISIT FOR WOUND CHECK   ENCOUNTER FOR REMOVAL OF SUTURES   Encounter for Removal of Sutures - Incision site is clean, dry and intact. - Wound cleansed, sutures removed, wound cleansed and bandage applied - Discussed pathology are still pending - Scars remodel for a full year. - Can apply over-the-counter silicone scar cream once to twice a day to help with scar remodeling if desired. - Patient advised to call with any concerns or if they notice any new or changing lesions.  Return for TBSE As Scheduled.  I, Jill Parcell, CMA, am acting as scribe for Boneta Sharps, MD.   Documentation: I have reviewed the above documentation for accuracy and completeness, and I agree with the above.  Boneta Sharps, MD

## 2024-01-28 LAB — ANATOMIC PATHOLOGY REPORT

## 2024-01-30 ENCOUNTER — Ambulatory Visit: Payer: Self-pay | Admitting: Dermatology

## 2024-03-12 ENCOUNTER — Other Ambulatory Visit: Payer: Self-pay | Admitting: Medical Genetics

## 2024-03-12 DIAGNOSIS — Z006 Encounter for examination for normal comparison and control in clinical research program: Secondary | ICD-10-CM

## 2024-04-18 ENCOUNTER — Ambulatory Visit: Admitting: Dermatology

## 2024-04-25 ENCOUNTER — Ambulatory Visit (INDEPENDENT_AMBULATORY_CARE_PROVIDER_SITE_OTHER): Admitting: Dermatology

## 2024-04-25 ENCOUNTER — Encounter: Payer: Self-pay | Admitting: Dermatology

## 2024-04-25 DIAGNOSIS — Z1283 Encounter for screening for malignant neoplasm of skin: Secondary | ICD-10-CM | POA: Diagnosis not present

## 2024-04-25 DIAGNOSIS — L905 Scar conditions and fibrosis of skin: Secondary | ICD-10-CM

## 2024-04-25 DIAGNOSIS — L814 Other melanin hyperpigmentation: Secondary | ICD-10-CM

## 2024-04-25 DIAGNOSIS — D239 Other benign neoplasm of skin, unspecified: Secondary | ICD-10-CM

## 2024-04-25 DIAGNOSIS — Z85820 Personal history of malignant melanoma of skin: Secondary | ICD-10-CM | POA: Diagnosis not present

## 2024-04-25 DIAGNOSIS — L821 Other seborrheic keratosis: Secondary | ICD-10-CM

## 2024-04-25 DIAGNOSIS — L578 Other skin changes due to chronic exposure to nonionizing radiation: Secondary | ICD-10-CM

## 2024-04-25 DIAGNOSIS — D489 Neoplasm of uncertain behavior, unspecified: Secondary | ICD-10-CM | POA: Diagnosis not present

## 2024-04-25 DIAGNOSIS — D2371 Other benign neoplasm of skin of right lower limb, including hip: Secondary | ICD-10-CM | POA: Diagnosis not present

## 2024-04-25 DIAGNOSIS — W908XXA Exposure to other nonionizing radiation, initial encounter: Secondary | ICD-10-CM | POA: Diagnosis not present

## 2024-04-25 DIAGNOSIS — D2272 Melanocytic nevi of left lower limb, including hip: Secondary | ICD-10-CM | POA: Diagnosis not present

## 2024-04-25 DIAGNOSIS — D1801 Hemangioma of skin and subcutaneous tissue: Secondary | ICD-10-CM

## 2024-04-25 DIAGNOSIS — D229 Melanocytic nevi, unspecified: Secondary | ICD-10-CM

## 2024-04-25 NOTE — Patient Instructions (Addendum)

## 2024-04-25 NOTE — Progress Notes (Signed)
 Follow-Up Visit   Subjective  Ann Rhodes is a 48 y.o. female who presents for the following: Skin Cancer Screening and Full Body Skin Exam. She has a history of skin cancer vs dysplastic nevus removed at back from > 11 years ago, hx of MM  Patient states hard spot on right leg she would like to get checked.   The patient presents for Total-Body Skin Exam (TBSE) for skin cancer screening and mole check. The patient has spots, moles and lesions to be evaluated, some may be new or changing and the patient may have concern these could be cancer.   The following portions of the chart were reviewed this encounter and updated as appropriate: medications, allergies, medical history  Review of Systems:  No other skin or systemic complaints except as noted in HPI or Assessment and Plan.  Objective  Well appearing patient in no apparent distress; mood and affect are within normal limits.  A full examination was performed including scalp, head, eyes, ears, nose, lips, neck, chest, axillae, abdomen, back, buttocks, bilateral upper extremities, bilateral lower extremities, hands, feet, fingers, toes, fingernails, and toenails. All findings within normal limits unless otherwise noted below.   Relevant physical exam findings are noted in the Assessment and Plan.  Right lateral proximal thigh 6 mm firm mobile pink papule    Assessment & Plan   SKIN CANCER SCREENING PERFORMED TODAY.  ACTINIC DAMAGE - Chronic condition, secondary to cumulative UV/sun exposure - diffuse scaly erythematous macules with underlying dyspigmentation - Recommend daily broad spectrum sunscreen SPF 30+ to sun-exposed areas, reapply every 2 hours as needed.  - Staying in the shade or wearing long sleeves, sun glasses (UVA+UVB protection) and wide brim hats (4-inch brim around the entire circumference of the hat) are also recommended for sun protection.  - Call for new or changing lesions.  LENTIGINES, SEBORRHEIC  KERATOSES, HEMANGIOMAS - Benign normal skin lesions - Benign-appearing - Call for any changes  MELANOCYTIC NEVI - Tan-brown and/or pink-flesh-colored symmetric macules and papules - Benign appearing on exam today - Observation - Call clinic for new or changing moles - Recommend daily use of broad spectrum spf 30+ sunscreen to sun-exposed areas.   History of Skin Cancer vs dysplastic nevus at back >11 years ago. Hypertrophic Scar. - Clear. Observe for recurrence. Well-healed scars x 2 at upper back - no cervical, axillary or inguinal lymphadenopathy - Call clinic for new or changing lesions.   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 Excision 01/19/2024. No residual MM, margins free.  - No evidence of recurrence today - No lymphadenopathy - Recommend regular full body skin exams - Recommend daily broad spectrum sunscreen SPF 30+ to sun-exposed areas, reapply every 2 hours as needed.  - Call if any new or changing lesions are noted between office visits   DERMATOFIBROMA Exam: Firm pink/brown papulenodule with dimple sign at right medial thigh. Treatment Plan: A dermatofibroma is a benign growth possibly related to trauma, such as an insect bite, cut from shaving, or inflamed acne-type bump.  Treatment options to remove include shave or excision with resulting scar and risk of recurrence.  Since benign-appearing and not bothersome, will observe for now.     SCAR secondary to surgery on left foot Exam: pink linear lesion. Benign-appearing.  Observation.  Call clinic for new or  changing lesions. Recommend daily broad spectrum sunscreen SPF 30+, reapply every 2 hours as needed. Treatment: No signs of infection today.  Recommend Serica moisturizing scar formula cream every night or [facility]  brand or Mederma silicone scar sheet every night for the first year after a scar appears to help with scar remodeling if desired. Scars remodel on their own for a full year and will gradually improve in appearance over time.   MELANOCYTIC NEVUS Exam: L plantar foot near heel, brown macule   Treatment Plan: Benign appearing on exam today. Recommend observation. Call clinic for new or changing moles. Recommend daily use of broad spectrum spf 30+ sunscreen to sun-exposed areas.   NEOPLASM OF UNCERTAIN BEHAVIOR Right lateral proximal thigh - Skin / nail biopsy Type of biopsy: punch   Informed consent: discussed and consent obtained   Timeout: patient name, date of birth, surgical site, and procedure verified   Procedure prep:  Patient was prepped and draped in usual sterile fashion Prep type:  Isopropyl alcohol Anesthesia: the lesion was anesthetized in a standard fashion   Anesthetic:  1% lidocaine  w/ epinephrine  1-100,000 buffered w/ 8.4% NaHCO3 Punch size:  4 mm Suture size:  4-0 Suture type: Prolene (polypropylene)   Suture removal (days):  7 Hemostasis achieved with: suture, pressure and aluminum chloride   Outcome: patient tolerated procedure well   Post-procedure details: sterile dressing applied and wound care instructions given   Dressing type: bandage and petrolatum    This Visit - Pathology ([facility]) MULTIPLE BENIGN NEVI   LENTIGINES   ACTINIC ELASTOSIS   SEBORRHEIC KERATOSES   CHERRY ANGIOMA   DERMATOFIBROMA    Return for suture removal, 3 months TBSE w/ Dr. Claudene.  IAlmetta Nora, RMA, am acting as scribe for Boneta Claudene, MD .   Documentation: I have reviewed the above documentation for accuracy and completeness, and I agree with the above.  Boneta Claudene, MD

## 2024-05-02 LAB — ANATOMIC PATHOLOGY REPORT

## 2024-05-08 ENCOUNTER — Ambulatory Visit: Payer: Self-pay | Admitting: Dermatology

## 2024-05-09 ENCOUNTER — Encounter: Admitting: Dermatology

## 2024-07-24 ENCOUNTER — Ambulatory Visit: Admitting: Dermatology

## 2025-01-04 ENCOUNTER — Ambulatory Visit: Admitting: Dermatology
